# Patient Record
Sex: Male | Born: 1960
Health system: Southern US, Community
[De-identification: ages and names within clinical notes are randomized; demographics above are authoritative.]

## PROBLEM LIST (undated history)

## (undated) DIAGNOSIS — I10 Essential (primary) hypertension: Secondary | ICD-10-CM

## (undated) DIAGNOSIS — S86119A Strain of other muscle(s) and tendon(s) of posterior muscle group at lower leg level, unspecified leg, initial encounter: Secondary | ICD-10-CM

## (undated) DIAGNOSIS — E119 Type 2 diabetes mellitus without complications: Secondary | ICD-10-CM

## (undated) HISTORY — DX: Strain of other muscle(s) and tendon(s) of posterior muscle group at lower leg level, unspecified leg, initial encounter: S86.119A

---

## 2005-03-14 ENCOUNTER — Emergency Department: Payer: Self-pay | Admitting: Unknown Physician Specialty

## 2006-08-31 ENCOUNTER — Ambulatory Visit: Payer: Self-pay | Admitting: Gastroenterology

## 2013-05-02 ENCOUNTER — Ambulatory Visit: Payer: Self-pay | Admitting: Family Medicine

## 2014-09-19 ENCOUNTER — Telehealth: Payer: Self-pay | Admitting: Family Medicine

## 2014-09-19 DIAGNOSIS — E785 Hyperlipidemia, unspecified: Secondary | ICD-10-CM

## 2014-09-19 NOTE — Telephone Encounter (Signed)
Pt is requesting a lb slip to labs rechecked.  YQ#[phone]/BMCB#[phone]/MJ

## 2014-09-19 NOTE — Telephone Encounter (Signed)
Printed please notify patient. Thanks.   

## 2014-09-23 NOTE — Telephone Encounter (Signed)
Pt advised as directed below.   Thanks,   -Basma Buchner  

## 2014-09-26 ENCOUNTER — Telehealth: Payer: Self-pay

## 2014-09-26 LAB — LIPID PANEL
Chol/HDL Ratio: 3.3 ratio units (ref 0.0–5.0)
Cholesterol, Total: 177 mg/dL (ref 100–199)
HDL: 54 mg/dL (ref >39)
LDL CALC: 100 mg/dL — AB (ref 0–99)
TRIGLYCERIDES: 117 mg/dL (ref 0–149)
VLDL Cholesterol Cal: 23 mg/dL (ref 5–40)

## 2014-09-26 LAB — COMPREHENSIVE METABOLIC PANEL
A/G RATIO: 1.3 (ref 1.1–2.5)
ALT: 25 IU/L (ref 0–44)
AST: 21 IU/L (ref 0–40)
Albumin: 4 g/dL (ref 3.5–5.5)
Alkaline Phosphatase: 55 IU/L (ref 39–117)
BILIRUBIN TOTAL: 0.4 mg/dL (ref 0.0–1.2)
BUN / CREAT RATIO: 17 (ref 9–20)
BUN: 15 mg/dL (ref 6–24)
CO2: 25 mmol/L (ref 18–29)
CREATININE: 0.89 mg/dL (ref 0.76–1.27)
Calcium: 9 mg/dL (ref 8.7–10.2)
Chloride: 97 mmol/L (ref 97–108)
GFR calc Af Amer: 111 mL/min/{1.73_m2} (ref >59)
GFR, EST NON AFRICAN AMERICAN: 96 mL/min/{1.73_m2} (ref >59)
GLOBULIN, TOTAL: 3 g/dL (ref 1.5–4.5)
Glucose: 99 mg/dL (ref 65–99)
POTASSIUM: 4.1 mmol/L (ref 3.5–5.2)
SODIUM: 140 mmol/L (ref 134–144)
Total Protein: 7 g/dL (ref 6.0–8.5)

## 2014-09-26 NOTE — Telephone Encounter (Signed)
-----   Message from Nancy Maloney, MD sent at 09/26/2014  8:53 AM EDT ----- Labs stable. Please notify patient. Thanks. 

## 2014-09-26 NOTE — Telephone Encounter (Signed)
LMTCB 09/26/2014  Thanks,   -Laura  

## 2014-09-26 NOTE — Telephone Encounter (Signed)
LMTCB 09/26/2014  Thanks,   -Verlia Kaney  

## 2014-09-26 NOTE — Telephone Encounter (Signed)
-----   Message from Lorie PhenixNancy Maloney, MD sent at 09/26/2014  8:53 AM EDT ----- Labs stable. Please notify patient. Thanks.

## 2014-09-29 NOTE — Telephone Encounter (Signed)
Pt advised.   Thanks,   -Laura  

## 2015-01-02 ENCOUNTER — Other Ambulatory Visit: Payer: Self-pay | Admitting: Family Medicine

## 2015-01-02 DIAGNOSIS — I10 Essential (primary) hypertension: Secondary | ICD-10-CM | POA: Insufficient documentation

## 2015-01-02 DIAGNOSIS — Z8739 Personal history of other diseases of the musculoskeletal system and connective tissue: Secondary | ICD-10-CM | POA: Insufficient documentation

## 2015-01-02 DIAGNOSIS — J309 Allergic rhinitis, unspecified: Secondary | ICD-10-CM | POA: Insufficient documentation

## 2015-01-02 DIAGNOSIS — M109 Gout, unspecified: Secondary | ICD-10-CM

## 2015-01-02 MED ORDER — INDAPAMIDE 2.5 MG PO TABS: 2.5000 mg | ORAL_TABLET | Freq: Every day | ORAL | Status: DC

## 2015-01-02 MED ORDER — ACEBUTOLOL HCL 400 MG PO CAPS: 400.0000 mg | ORAL_CAPSULE | Freq: Two times a day (BID) | ORAL | Status: DC

## 2015-01-02 MED ORDER — ALLOPURINOL 300 MG PO TABS: 300.0000 mg | ORAL_TABLET | Freq: Every day | ORAL | Status: DC

## 2015-01-02 NOTE — Telephone Encounter (Signed)
Pt contacted office for refill request on the following medications: Acebutolol HCI 400 mg, Allopurinol 300 mg, & Indapamide 2.5 mg to [facility]. Thanks TNP

## 2015-01-02 NOTE — Addendum Note (Signed)
Addended by: Kavin LeechWALSH, LAURA E on: 01/02/2015 05:11 PM   Modules accepted: Orders

## 2015-01-23 ENCOUNTER — Other Ambulatory Visit: Payer: Self-pay

## 2015-01-23 ENCOUNTER — Other Ambulatory Visit: Payer: Self-pay | Admitting: Family Medicine

## 2015-01-23 DIAGNOSIS — I10 Essential (primary) hypertension: Secondary | ICD-10-CM

## 2015-01-23 DIAGNOSIS — M109 Gout, unspecified: Secondary | ICD-10-CM

## 2015-01-23 MED ORDER — ACEBUTOLOL HCL 400 MG PO CAPS: 400.0000 mg | ORAL_CAPSULE | Freq: Two times a day (BID) | ORAL | Status: DC

## 2015-01-23 MED ORDER — ALLOPURINOL 300 MG PO TABS: 300.0000 mg | ORAL_TABLET | Freq: Every day | ORAL | Status: DC

## 2015-01-23 MED ORDER — INDAPAMIDE 2.5 MG PO TABS: 2.5000 mg | ORAL_TABLET | Freq: Every day | ORAL | Status: DC

## 2015-01-23 NOTE — Telephone Encounter (Signed)
Pt contacted office for refill request on the following medications: Pt states the pharmacy has not rec'd these.   [facility]  [place], [place]/phone #[phone].  CB#[phone]/MW  acebutolol (SECTRAL) 400 MG  indapamide (LOZOL) 2.5 MG tablet  allopurinol (ZYLOPRIM) 300 MG tablet

## 2015-01-30 ENCOUNTER — Encounter: Payer: Self-pay | Admitting: Family Medicine

## 2015-03-13 DIAGNOSIS — D509 Iron deficiency anemia, unspecified: Secondary | ICD-10-CM | POA: Insufficient documentation

## 2015-03-13 DIAGNOSIS — K5900 Constipation, unspecified: Secondary | ICD-10-CM | POA: Insufficient documentation

## 2015-03-13 DIAGNOSIS — Z8669 Personal history of other diseases of the nervous system and sense organs: Secondary | ICD-10-CM | POA: Insufficient documentation

## 2015-03-19 ENCOUNTER — Encounter: Payer: Self-pay | Admitting: Family Medicine

## 2015-03-19 ENCOUNTER — Ambulatory Visit (INDEPENDENT_AMBULATORY_CARE_PROVIDER_SITE_OTHER): Payer: BLUE CROSS/BLUE SHIELD | Admitting: Family Medicine

## 2015-03-19 VITALS — BP 124/82 | HR 60 | Temp 98.0°F | Resp 16 | Ht 70.0 in | Wt 201.0 lb

## 2015-03-19 DIAGNOSIS — Z125 Encounter for screening for malignant neoplasm of prostate: Secondary | ICD-10-CM

## 2015-03-19 DIAGNOSIS — M1000 Idiopathic gout, unspecified site: Secondary | ICD-10-CM | POA: Diagnosis not present

## 2015-03-19 DIAGNOSIS — K5900 Constipation, unspecified: Secondary | ICD-10-CM

## 2015-03-19 DIAGNOSIS — E785 Hyperlipidemia, unspecified: Secondary | ICD-10-CM | POA: Diagnosis not present

## 2015-03-19 DIAGNOSIS — I10 Essential (primary) hypertension: Secondary | ICD-10-CM | POA: Diagnosis not present

## 2015-03-19 DIAGNOSIS — Z0000 Encounter for general adult medical examination without abnormal findings: Secondary | ICD-10-CM | POA: Diagnosis not present

## 2015-03-19 MED ORDER — POLYETHYLENE GLYCOL 3350 17 GM/SCOOP PO POWD: 17.0000 g | Freq: Every day | ORAL | Status: AC

## 2015-03-19 NOTE — Progress Notes (Signed)
Patient ID: Benjamin [name], male   DOB: May 17, 1960, 54 y.o.   MRN: [id number]        Patient: Benjamin [name], Male    DOB: May 17, 1960, 54 y.o.   MRN: [id number] Visit Date: 03/19/2015  Today's Provider: Lorie PhenixNancy Daylyn Azbill, MD   Chief Complaint  Patient presents with  . Annual Exam   Subjective:    Annual physical exam Benjamin [name] is a 54 y.o. male who presents today for health maintenance and complete physical. He feels well. He reports exercising active with daily activities. He reports he is sleeping well.  Does have stress at work. Thinks handling it well.    01/27/14 CPE 12/29/09 Colonoscopy 01/27/14 PSA- 0.9  Lab Results  Component Value Date   GLUCOSE 99 09/25/2014   CHOL 177 09/25/2014   TRIG 117 09/25/2014   HDL 54 09/25/2014   LDLCALC 100* 09/25/2014   ALT 25 09/25/2014   AST 21 09/25/2014   NA 140 09/25/2014   K 4.1 09/25/2014   CL 97 09/25/2014   CREATININE 0.89 09/25/2014   BUN 15 09/25/2014   CO2 25 09/25/2014    -----------------------------------------------------------------   Review of Systems  Constitutional: Negative.   HENT: Positive for voice change.   Eyes: Negative.   Respiratory: Negative.   Cardiovascular: Negative.   Gastrointestinal: Negative.   Endocrine: Negative.   Genitourinary: Negative.   Musculoskeletal: Negative.   Skin: Negative.   Allergic/Immunologic: Positive for environmental allergies.  Neurological: Negative.   Hematological: Negative.   Psychiatric/Behavioral: Negative.     Social History      He  reports that he has never smoked. He has never used smokeless tobacco. He reports that he does not drink alcohol or use illicit drugs.       Social History   Social History  . Marital Status: Married    Spouse Name: N/A  . Number of Children: N/A  . Years of Education: N/A   Social History Main Topics  . Smoking status: Never Smoker   . Smokeless tobacco: Never Used  . Alcohol Use: No  . Drug Use: No   . Sexual Activity: Not Asked   Other Topics Concern  . None   Social History Narrative    History reviewed. No pertinent past medical history.   Patient Active Problem List   Diagnosis Date Noted  . CN (constipation) 03/13/2015  . History of migraine headaches 03/13/2015  . Anemia, iron deficiency 03/13/2015  . Allergic rhinitis 01/02/2015  . Essential (primary) hypertension 01/02/2015  . Gout 01/02/2015  . Hyperlipemia 09/19/2014    History reviewed. No pertinent past surgical history.  Family History        Family Status  Relation Status Death Age  . Mother Deceased     stroke  . Father Deceased 7480    cancer  . Sister Alive   . Brother Alive         His family history includes Cancer in his sister; Diabetes in his mother; Heart disease in his brother and mother.    No Known Allergies  Previous Medications   ACEBUTOLOL (SECTRAL) 400 MG CAPSULE    Take 1 capsule (400 mg total) by mouth 2 (two) times daily.   ALLOPURINOL (ZYLOPRIM) 300 MG TABLET    Take 1 tablet (300 mg total) by mouth daily.   INDAPAMIDE (LOZOL) 2.5 MG TABLET    Take 1 tablet (2.5 mg total) by mouth daily.    Patient Care Team: Lorie PhenixNancy Montague Corella,  MD as PCP - General (Family Medicine)     Objective:   Vitals: BP 124/82 mmHg  Pulse 60  Temp(Src) 98 F (36.7 C) (Oral)  Resp 16  Ht  (1.778 m)  Wt 201 lb (91.173 kg)  BMI 28.84 kg/m2  SpO2 98%   Physical Exam  Constitutional: He is oriented to person, place, and time. He appears well-developed and well-nourished.  HENT:  Head: Normocephalic and atraumatic.  Right Ear: External ear normal.  Left Ear: External ear normal.  Nose: Nose normal.  Mouth/Throat: Oropharynx is clear and moist.  Eyes: Conjunctivae and EOM are normal. Pupils are equal, round, and reactive to light.  Neck: Normal range of motion. Neck supple.  Cardiovascular: Normal rate, regular rhythm, normal heart sounds and intact distal pulses.   Pulmonary/Chest: Effort  normal and breath sounds normal.  Abdominal: Soft. Bowel sounds are normal.  Musculoskeletal: Normal range of motion.  Neurological: He is alert and oriented to person, place, and time.  Skin: Skin is warm and dry.  Psychiatric: He has a normal mood and affect. His behavior is normal. Judgment and thought content normal.     Depression Screen PHQ 2/9 Scores 03/19/2015  PHQ - 2 Score 0      Assessment & Plan:     Routine Health Maintenance and Physical Exam  Exercise Activities and Dietary recommendations Goals    . Exercise 150 minutes per week (moderate activity)       Immunization History  Administered Date(s) Administered  . Td 05/20/2013  . Tdap 05/20/2013   1. Annual physical exam Stable. Encouraged patient to exercise more.   2. Idiopathic gout, unspecified chronicity, unspecified site Stable. Check labs.  - Uric acid  3. Essential (primary) hypertension Condition is stable. Please continue current medication and  plan of care as noted.    4. Hyperlipemia Will check labs.  - Comprehensive metabolic panel - Hemoglobin A1c - Lipid panel  5. Prostate cancer screening - PSA  6. Constipation, unspecified constipation type Will try Miralax as needed.   - polyethylene glycol powder (GLYCOLAX/MIRALAX) powder; Take 17 g by mouth daily.  Dispense: 3350 g; Refill: 1  Patient was seen and examined by Leo Grosser, MD, and note scribed by Rondel Baton, CMA.  I have reviewed the document for accuracy and completeness and I agree with above. Leo Grosser, MD   Lorie Phenix, MD      --------------------------------------------------------------------

## 2015-04-07 IMAGING — CR DG KNEE 1-2V*L*
1 series · 3 of 3 positions shown · non-contrast
Comparison: None.

CLINICAL DATA: Knee pain

EXAM:
LEFT KNEE - 1-2 VIEW

[Series 1: ap · 0.17mm/px · 3 of 3 slices shown]
[im 1/3]
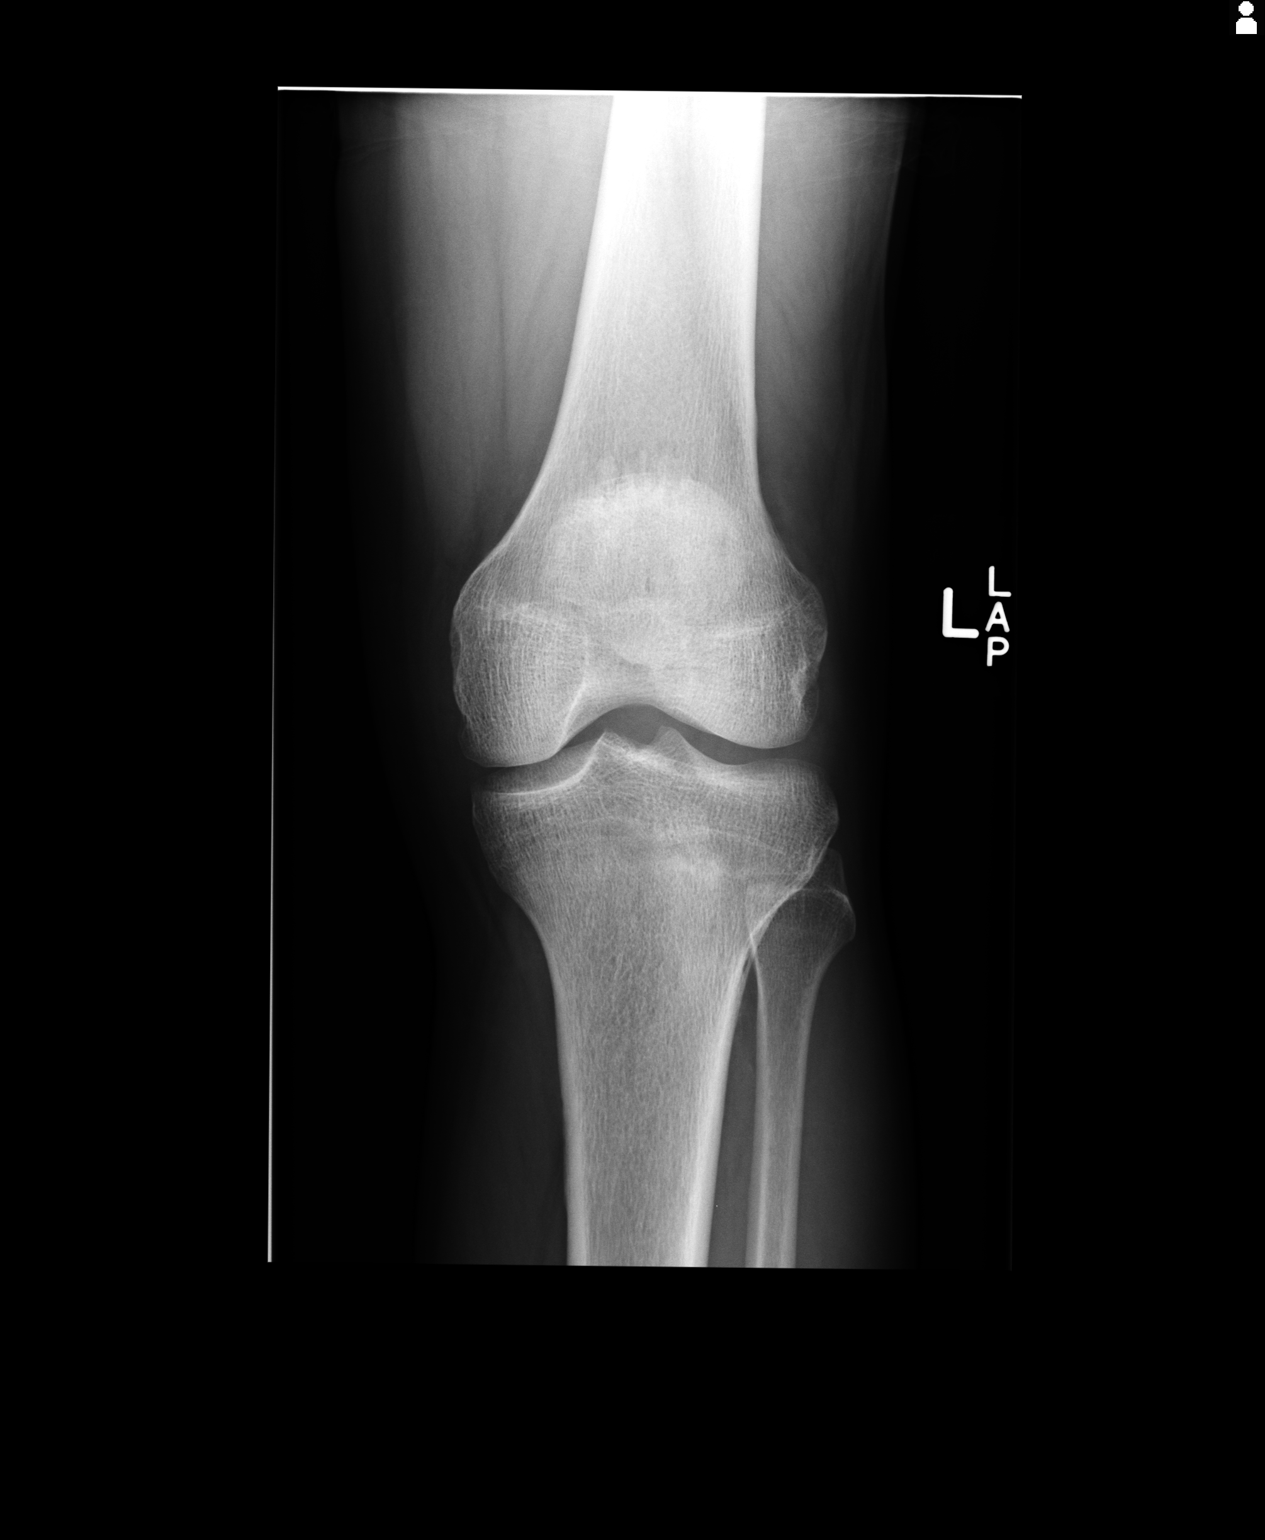
[im 2/3]
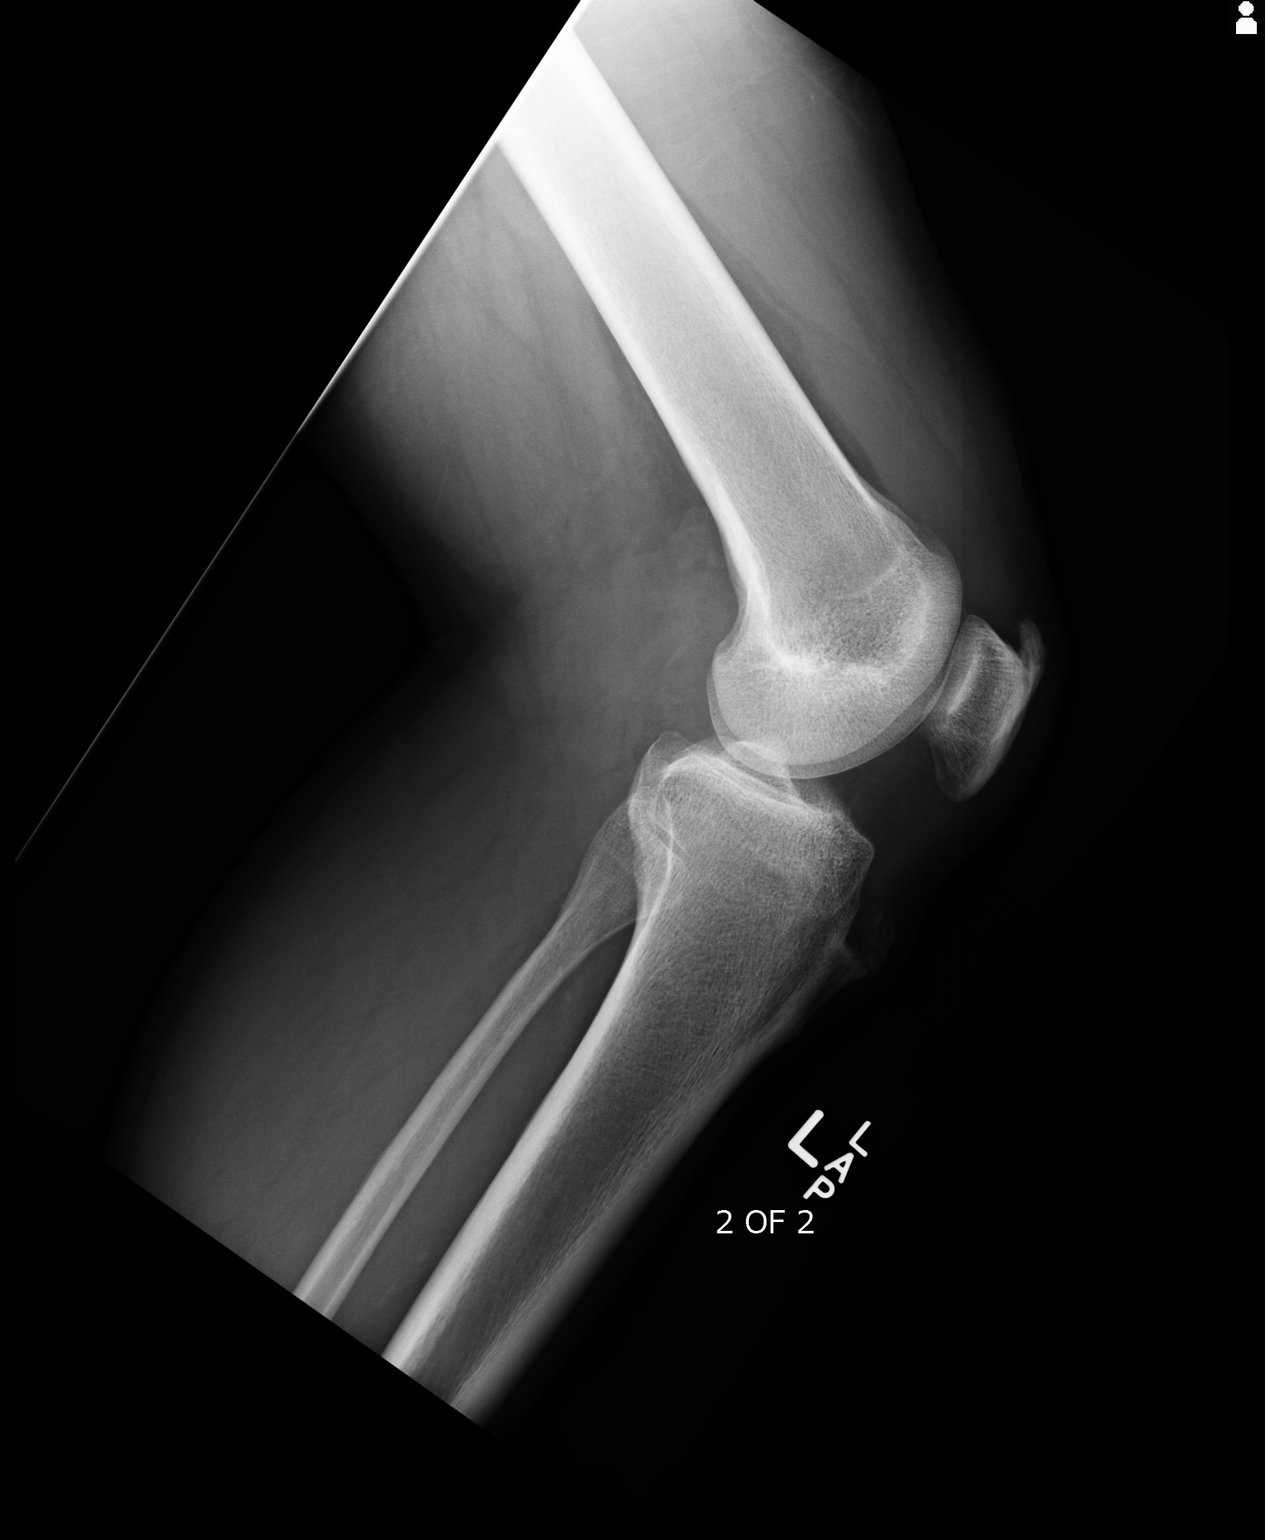
[im 3/3]
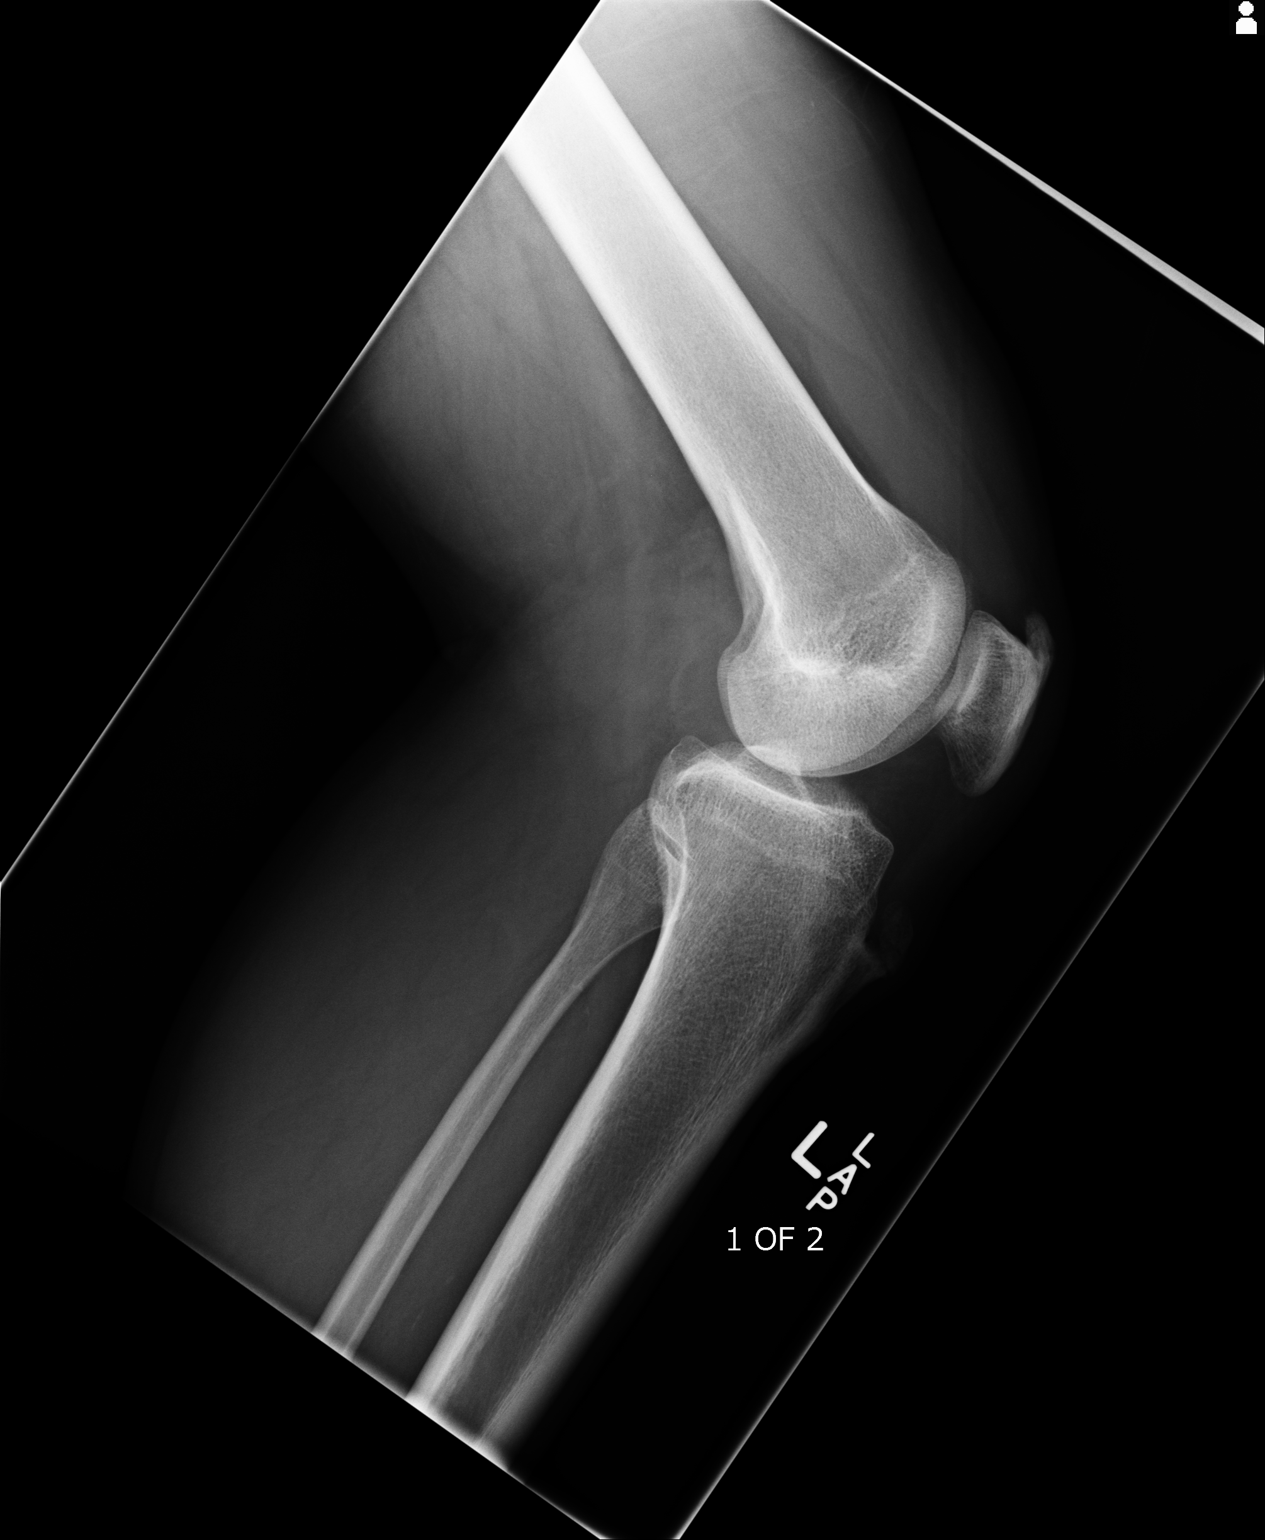

[3 of 3 positions shown; findings below may reference images not displayed]

FINDINGS: Frontal and lateral views were obtained. There is no fracture,
dislocation, or effusion. Joint spaces appear intact. There is a
prominent spur arising from the anterior superior patella.
IMPRESSION: Prominent anterior superior patellar spur. No fracture or effusion.
No erosive change.

## 2015-04-11 LAB — COMPREHENSIVE METABOLIC PANEL
ALT: 38 IU/L (ref 0–44)
AST: 28 IU/L (ref 0–40)
Albumin/Globulin Ratio: 1.2 (ref 1.1–2.5)
Albumin: 4 g/dL (ref 3.5–5.5)
Alkaline Phosphatase: 58 IU/L (ref 39–117)
BUN/Creatinine Ratio: 10 (ref 9–20)
BUN: 10 mg/dL (ref 6–24)
Bilirubin Total: 0.3 mg/dL (ref 0.0–1.2)
CO2: 25 mmol/L (ref 18–29)
CREATININE: 0.98 mg/dL (ref 0.76–1.27)
Calcium: 9 mg/dL (ref 8.7–10.2)
Chloride: 98 mmol/L (ref 96–106)
GFR, EST AFRICAN AMERICAN: 100 mL/min/{1.73_m2} (ref >59)
GFR, EST NON AFRICAN AMERICAN: 86 mL/min/{1.73_m2} (ref >59)
Globulin, Total: 3.4 g/dL (ref 1.5–4.5)
Glucose: 134 mg/dL — ABNORMAL HIGH (ref 65–99)
Potassium: 4.1 mmol/L (ref 3.5–5.2)
SODIUM: 138 mmol/L (ref 134–144)
TOTAL PROTEIN: 7.4 g/dL (ref 6.0–8.5)

## 2015-04-11 LAB — LIPID PANEL
CHOLESTEROL TOTAL: 164 mg/dL (ref 100–199)
Chol/HDL Ratio: 3.3 ratio units (ref 0.0–5.0)
HDL: 49 mg/dL (ref >39)
LDL CALC: 94 mg/dL (ref 0–99)
Triglycerides: 105 mg/dL (ref 0–149)
VLDL CHOLESTEROL CAL: 21 mg/dL (ref 5–40)

## 2015-04-11 LAB — HEMOGLOBIN A1C
ESTIMATED AVERAGE GLUCOSE: 128 mg/dL
Hgb A1c MFr Bld: 6.1 % — ABNORMAL HIGH (ref 4.8–5.6)

## 2015-04-11 LAB — URIC ACID: Uric Acid: 5.9 mg/dL (ref 3.7–8.6)

## 2015-04-11 LAB — PSA: Prostate Specific Ag, Serum: 0.6 ng/mL (ref 0.0–4.0)

## 2015-04-13 ENCOUNTER — Telehealth: Payer: Self-pay

## 2015-04-13 NOTE — Telephone Encounter (Signed)
-----   Message from Lorie Phenix, MD sent at 04/12/2015  8:47 AM EST ----- Labs fairly stable. Blood sugar in pre-diabetic range at 6.1%.  Cholesterol in normal range and 10 year risk of heart disease is 9.8%, right at border line for starting medication medication secondary to hypertension. Also, if develops diabetes, risks go up to 17.9%. Make sure to eat healthy and exercise.  Important to recheck in one year. Please see if patient wants to start mediation or continue to work on lifestyle changes.   Thanks.

## 2015-04-13 NOTE — Telephone Encounter (Signed)
Pt advised, he says he is going to work on lifestyle changes before starting a medication.  Thanks,   -Vernona Rieger

## 2015-04-13 NOTE — Telephone Encounter (Signed)
LMTCB Kymoni Monday Drozdowski, CMA  

## 2015-04-13 NOTE — Telephone Encounter (Signed)
-----   Message from Nancy Maloney, MD sent at 04/12/2015  8:47 AM EST ----- Labs fairly stable. Blood sugar in pre-diabetic range at 6.1%.  Cholesterol in normal range and 10 year risk of heart disease is 9.8%, right at border line for starting medication medication secondary to hypertension. Also, if develops diabetes, risks go up to 17.9%. Make sure to eat healthy and exercise.  Important to recheck in one year. Please see if patient wants to start mediation or continue to work on lifestyle changes.   Thanks. 

## 2015-05-12 ENCOUNTER — Ambulatory Visit (INDEPENDENT_AMBULATORY_CARE_PROVIDER_SITE_OTHER): Payer: BLUE CROSS/BLUE SHIELD | Admitting: Family Medicine

## 2015-05-12 ENCOUNTER — Encounter: Payer: Self-pay | Admitting: Family Medicine

## 2015-05-12 VITALS — BP 104/80 | HR 56 | Temp 98.4°F | Resp 16 | Wt 203.0 lb

## 2015-05-12 DIAGNOSIS — J069 Acute upper respiratory infection, unspecified: Secondary | ICD-10-CM | POA: Diagnosis not present

## 2015-05-12 DIAGNOSIS — I10 Essential (primary) hypertension: Secondary | ICD-10-CM

## 2015-05-12 DIAGNOSIS — S86119A Strain of other muscle(s) and tendon(s) of posterior muscle group at lower leg level, unspecified leg, initial encounter: Secondary | ICD-10-CM | POA: Insufficient documentation

## 2015-05-12 DIAGNOSIS — S86112A Strain of other muscle(s) and tendon(s) of posterior muscle group at lower leg level, left leg, initial encounter: Secondary | ICD-10-CM

## 2015-05-12 DIAGNOSIS — S86812A Strain of other muscle(s) and tendon(s) at lower leg level, left leg, initial encounter: Secondary | ICD-10-CM | POA: Diagnosis not present

## 2015-05-12 DIAGNOSIS — M109 Gout, unspecified: Secondary | ICD-10-CM | POA: Diagnosis not present

## 2015-05-12 HISTORY — DX: Strain of other muscle(s) and tendon(s) of posterior muscle group at lower leg level, unspecified leg, initial encounter: S86.119A

## 2015-05-12 MED ORDER — INDAPAMIDE 2.5 MG PO TABS: 2.5000 mg | ORAL_TABLET | Freq: Every day | ORAL | Status: DC

## 2015-05-12 MED ORDER — ACEBUTOLOL HCL 400 MG PO CAPS: 400.0000 mg | ORAL_CAPSULE | Freq: Two times a day (BID) | ORAL | Status: DC

## 2015-05-12 MED ORDER — ALLOPURINOL 300 MG PO TABS: 300.0000 mg | ORAL_TABLET | Freq: Every day | ORAL | Status: DC

## 2015-05-12 NOTE — Progress Notes (Signed)
Subjective:    Patient ID: Benjamin [name], male    DOB: [birth year], 55 y.o.   MRN: [id number]  URI  This is a new problem. The current episode started in the past 7 days ("couple days"). The problem has been unchanged. Maximum temperature: Pt has not checked temperature but had chills and sweats. Associated symptoms include congestion, coughing (productive with white sputum), rhinorrhea and sneezing. Pertinent negatives include no abdominal pain, chest pain, diarrhea, dysuria, ear pain, headaches, nausea, neck pain, plugged ear sensation, rash, sinus pain, sore throat, swollen glands, vomiting or wheezing. Associated symptoms comments: Myalgias are present in the LLE. Treatments tried: Coricidin, increasing fluids, Robitussin. The treatment provided no relief.      Review of Systems  Constitutional: Positive for chills and diaphoresis.  HENT: Positive for congestion, rhinorrhea and sneezing. Negative for ear pain and sore throat.   Respiratory: Positive for cough (productive with white sputum). Negative for wheezing.   Cardiovascular: Negative for chest pain.  Gastrointestinal: Negative for nausea, vomiting, abdominal pain and diarrhea.  Genitourinary: Negative for dysuria.  Musculoskeletal: Positive for myalgias (LLE). Negative for neck pain.  Skin: Negative for rash.  Neurological: Negative for headaches.   BP 104/80 mmHg  Pulse 56  Temp(Src) 98.4 F (36.9 C) (Oral)  Resp 16  Wt 203 lb (92.08 kg)  SpO2 98%   Patient Active Problem List   Diagnosis Date Noted  . Prostate cancer screening 03/19/2015  . CN (constipation) 03/13/2015  . History of migraine headaches 03/13/2015  . Anemia, iron deficiency 03/13/2015  . Allergic rhinitis 01/02/2015  . Essential (primary) hypertension 01/02/2015  . Gout 01/02/2015  . Hyperlipemia 09/19/2014   No past medical history on file. Current Outpatient Prescriptions on File Prior to Visit  Medication Sig  . acebutolol (SECTRAL) 400 MG  capsule Take 1 capsule (400 mg total) by mouth 2 (two) times daily.  Marland Kitchen allopurinol (ZYLOPRIM) 300 MG tablet Take 1 tablet (300 mg total) by mouth daily.  . indapamide (LOZOL) 2.5 MG tablet Take 1 tablet (2.5 mg total) by mouth daily.  . polyethylene glycol powder (GLYCOLAX/MIRALAX) powder Take 17 g by mouth daily.   No current facility-administered medications on file prior to visit.   No Known Allergies No past surgical history on file. Social History   Social History  . Marital Status: Married    Spouse Name: N/A  . Number of Children: N/A  . Years of Education: N/A   Occupational History  . Not on file.   Social History Main Topics  . Smoking status: Never Smoker   . Smokeless tobacco: Never Used  . Alcohol Use: No  . Drug Use: No  . Sexual Activity: Not on file   Other Topics Concern  . Not on file   Social History Narrative   Family History  Problem Relation Age of Onset  . Diabetes Mother   . Heart disease Mother   . Cancer Sister   . Heart disease Brother       Objective:   Physical Exam  Constitutional: He is oriented to person, place, and time. He appears well-developed and well-nourished.  HENT:  Head: Normocephalic and atraumatic.  Right Ear: Tympanic membrane and external ear normal.  Left Ear: Tympanic membrane and external ear normal.  Nose: Mucosal edema and rhinorrhea present. Right sinus exhibits no maxillary sinus tenderness. Left sinus exhibits no maxillary sinus tenderness.  Mouth/Throat: Uvula is midline and oropharynx is clear and moist.  Eyes: Conjunctivae and EOM are  normal. Pupils are equal, round, and reactive to light.  Neck: Normal range of motion. Neck supple.  Cardiovascular: Normal rate and regular rhythm.   Pulmonary/Chest: Effort normal and breath sounds normal. He has no wheezes. He has no rales.  Neurological: He is alert and oriented to person, place, and time.   BP 104/80 mmHg  Pulse 56  Temp(Src) 98.4 F (36.9 C) (Oral)   Resp 16  Wt 203 lb (92.08 kg)  SpO2 98%      Assessment & Plan:  1. Essential (primary) hypertension Condition is stable. Please continue current medication and  plan of care as noted.   - acebutolol (SECTRAL) 400 MG capsule; Take 1 capsule (400 mg total) by mouth 2 (two) times daily.  Dispense: 180 capsule; Refill: 3 - indapamide (LOZOL) 2.5 MG tablet; Take 1 tablet (2.5 mg total) by mouth daily.  Dispense: 90 tablet; Refill: 3  2. Gout, unspecified cause, unspecified chronicity, unspecified site Condition is stable. Please continue current medication and  plan of care as noted.   - allopurinol (ZYLOPRIM) 300 MG tablet; Take 1 tablet (300 mg total) by mouth daily.  Dispense: 90 tablet; Refill: 3  3. Gastrocnemius muscle tear, left, initial encounter Stretch more regularly. Use rolling stick to decrease tension and help healing.   Patient instructed to call back if condition worsens or does not improve.     4. Upper respiratory infection Flu is negative.  Will treat OTC. Patient instructed to call back if condition worsens or does not improve.    Results for orders placed or performed in visit on 05/12/15  POCT Influenza A/B  Result Value Ref Range   Influenza A, POC Negative Negative   Influenza B, POC Negative Negative   Patient was seen and examined by Leo Grosser, MD, and note scribed by Allene Dillon, CMA. I have reviewed the document for accuracy and completeness and I agree with above. Leo Grosser, MD   Lorie Phenix, MD

## 2015-05-13 LAB — POCT INFLUENZA A/B
INFLUENZA B, POC: NEGATIVE
Influenza A, POC: NEGATIVE

## 2015-08-10 DIAGNOSIS — H40031 Anatomical narrow angle, right eye: Secondary | ICD-10-CM | POA: Diagnosis not present

## 2015-08-10 DIAGNOSIS — H0289 Other specified disorders of eyelid: Secondary | ICD-10-CM | POA: Diagnosis not present

## 2015-08-10 DIAGNOSIS — H2513 Age-related nuclear cataract, bilateral: Secondary | ICD-10-CM | POA: Diagnosis not present

## 2015-08-10 DIAGNOSIS — H40032 Anatomical narrow angle, left eye: Secondary | ICD-10-CM | POA: Diagnosis not present

## 2016-03-22 ENCOUNTER — Encounter: Payer: BLUE CROSS/BLUE SHIELD | Admitting: Family Medicine

## 2016-03-22 DIAGNOSIS — T2661XA Corrosion of cornea and conjunctival sac, right eye, initial encounter: Secondary | ICD-10-CM | POA: Diagnosis not present

## 2016-03-23 ENCOUNTER — Ambulatory Visit (INDEPENDENT_AMBULATORY_CARE_PROVIDER_SITE_OTHER): Payer: BLUE CROSS/BLUE SHIELD | Admitting: Family Medicine

## 2016-03-23 ENCOUNTER — Encounter: Payer: Self-pay | Admitting: Family Medicine

## 2016-03-23 VITALS — BP 146/92 | HR 52 | Temp 97.9°F | Resp 16 | Ht 70.0 in | Wt 208.0 lb

## 2016-03-23 DIAGNOSIS — E119 Type 2 diabetes mellitus without complications: Secondary | ICD-10-CM | POA: Insufficient documentation

## 2016-03-23 DIAGNOSIS — I10 Essential (primary) hypertension: Secondary | ICD-10-CM | POA: Diagnosis not present

## 2016-03-23 DIAGNOSIS — Z8739 Personal history of other diseases of the musculoskeletal system and connective tissue: Secondary | ICD-10-CM | POA: Diagnosis not present

## 2016-03-23 DIAGNOSIS — Z0000 Encounter for general adult medical examination without abnormal findings: Secondary | ICD-10-CM | POA: Diagnosis not present

## 2016-03-23 DIAGNOSIS — Z125 Encounter for screening for malignant neoplasm of prostate: Secondary | ICD-10-CM | POA: Diagnosis not present

## 2016-03-23 DIAGNOSIS — R739 Hyperglycemia, unspecified: Secondary | ICD-10-CM | POA: Diagnosis not present

## 2016-03-23 DIAGNOSIS — E790 Hyperuricemia without signs of inflammatory arthritis and tophaceous disease: Secondary | ICD-10-CM | POA: Insufficient documentation

## 2016-03-23 DIAGNOSIS — T2661XD Corrosion of cornea and conjunctival sac, right eye, subsequent encounter: Secondary | ICD-10-CM | POA: Diagnosis not present

## 2016-03-23 DIAGNOSIS — Z1159 Encounter for screening for other viral diseases: Secondary | ICD-10-CM

## 2016-03-23 NOTE — Progress Notes (Signed)
Patient: Benjamin Benjamin [name] Hapke, Male    DOB: 27-Feb-1961, 56 y.o.   MRN: [id number] Visit Date: 03/23/2016  Today's Provider: Mila Merryonald Fisher, MD   Chief Complaint  Patient presents with  . Annual Exam   Subjective:   This is Benjamin [name] previous patient of Dr. Elease HashimotoMaloney present today as new patient to me to establish care and follow up on chronic medical problems.    Annual physical exam Benjamin Benjamin [name] is Benjamin [name] 56 y.o. male who presents today for health maintenance and complete physical. He feels well. He reports exercising not regularly. He reports he is sleeping well.  Colonoscopy- 12/29/2009 by previous patient report. Patient reports that this was normal.    Hypertension, follow-up:  BP Readings from Last 3 Encounters:  03/23/16 (!) 148/84  05/12/15 104/80  03/19/15 124/82    He was last seen for hypertension 10 months ago.  BP at that visit was 104/80. Management since that visit includes no change. He reports good compliance with treatment. He is not having side effects.  He is not exercising. He is adherent to low salt diet.   Outside blood pressures are checked occasionally. Patient denies exertional chest pressure/discomfort, lower extremity edema, palpitations and tachypnea.   Cardiovascular risk factors include dyslipidemia.     Weight trend: stable Wt Readings from Last 3 Encounters:  03/23/16 208 lb (94.3 kg)  05/12/15 203 lb (92.1 kg)  03/19/15 201 lb (91.2 kg)    Current diet: well balanced   Gout, follow up: Patient was last seen for this about 10 months ago. No changes were made in his medications. Patient currently takes allopurinol, and reports that this controls his symptoms. Has had no gout flares for Benjamin [name] few years.   Follow up elevated blood sugar. Lab Results  Component Value Date   HGBA1C 6.1 (H) 04/10/2015     He does consume Benjamin [name] lot of fruit and some fruit juices. Otherwise avoid sweets. Has not been exercising as much as usually lately. Does have  family history of diabetes.   Review of Systems  Constitutional: Negative.   HENT: Negative.   Eyes: Positive for redness.       Due to chemical exposure at work.  Respiratory: Negative.   Cardiovascular: Negative.   Gastrointestinal: Negative.   Endocrine: Negative.   Genitourinary: Negative.   Musculoskeletal: Negative.   Skin: Positive for color change and rash.       Due to chemical exposure at work yesterday.   Allergic/Immunologic: Negative.   Neurological: Negative.   Hematological: Negative.   Psychiatric/Behavioral: Negative.     Social History      He  reports that he has never smoked. He has never used smokeless tobacco. He reports that he does not drink alcohol or use drugs.       Social History   Social History  . Marital status: Married    Spouse name: N/Benjamin [name]  . Number of children: N/Benjamin [name]  . Years of education: N/Benjamin [name]   Social History Main Topics  . Smoking status: Never Smoker  . Smokeless tobacco: Never Used  . Alcohol use No  . Drug use: No  . Sexual activity: Not Asked   Other Topics Concern  . None   Social History Narrative  . None    No past medical history on file.   Patient Active Problem List   Diagnosis Date Noted  . Gastrocnemius muscle tear 05/12/2015  . Upper respiratory infection 05/12/2015  . Prostate  cancer screening 03/19/2015  . CN (constipation) 03/13/2015  . History of migraine headaches 03/13/2015  . Anemia, iron deficiency 03/13/2015  . Allergic rhinitis 01/02/2015  . Essential (primary) hypertension 01/02/2015  . Gout 01/02/2015  . Hyperlipemia 09/19/2014    No past surgical history on file.  Family History        Family Status  Relation Status  . Mother Deceased   stroke  . Father Deceased at age 10   cancer  . Sister Alive  . Brother Alive        His family history includes Cancer in his sister; Diabetes in his mother; Heart disease in his brother and mother.     No Known Allergies   Current Outpatient  Prescriptions:  .  acebutolol (SECTRAL) 400 MG capsule, Take 1 capsule (400 mg total) by mouth 2 (two) times daily., Disp: 180 capsule, Rfl: 3 .  allopurinol (ZYLOPRIM) 300 MG tablet, Take 1 tablet (300 mg total) by mouth daily., Disp: 90 tablet, Rfl: 3 .  indapamide (LOZOL) 2.5 MG tablet, Take 1 tablet (2.5 mg total) by mouth daily., Disp: 90 tablet, Rfl: 3 .  polyethylene glycol powder (GLYCOLAX/MIRALAX) powder, Take 17 g by mouth daily., Disp: 3350 g, Rfl: 1   Patient Care Team: Malva Limes, MD as PCP - General (Family Medicine)      Objective:   Vitals: BP (!) 148/84 (BP Location: Right Arm, Patient Position: Sitting, Cuff Size: Normal)   Pulse (!) 52   Temp 97.9 F (36.6 C)   Resp 16   Ht 5\' 10"  (1.778 m)   Wt 208 lb (94.3 kg)   SpO2 98%   BMI 29.84 kg/m    Physical Exam   General Appearance:    Alert, cooperative, no distress, appears stated age  Head:    Normocephalic, without obvious abnormality, atraumatic  Eyes:    PERRL, conjunctiva/corneas clear, EOM's intact, fundi    benign, both eyes       Ears:    Normal TM's and external ear canals, both ears  Nose:   Nares normal, septum midline, mucosa normal, no drainage   or sinus tenderness  Throat:   Lips, mucosa, and tongue normal; teeth and gums normal  Neck:   Supple, symmetrical, trachea midline, no adenopathy;       thyroid:  No enlargement/tenderness/nodules; no carotid   bruit or JVD  Back:     Symmetric, no curvature, ROM normal, no CVA tenderness  Lungs:     Clear to auscultation bilaterally, respirations unlabored  Chest wall:    No tenderness or deformity  Heart:    Regular rate and rhythm, S1 and S2 normal, no murmur, rub   or gallop  Abdomen:     Soft, non-tender, bowel sounds active all four quadrants,    no masses, no organomegaly  Genitalia:    deferred  Rectal:    deferred  Extremities:   Extremities normal, atraumatic, no cyanosis or edema  Pulses:   2+ and symmetric all extremities  Skin:    Skin color, texture, turgor normal, no rashes or lesions  Lymph nodes:   Cervical, supraclavicular, and axillary nodes normal  Neurologic:   CNII-XII intact. Normal strength, sensation and reflexes      throughout    Depression Screen PHQ 2/9 Scores 03/19/2015  PHQ - 2 Score 0      Assessment & Plan:     Routine Health Maintenance and Physical Exam  Exercise Activities and Dietary recommendations  Goals    . Exercise 150 minutes per week (moderate activity)       Immunization History  Administered Date(s) Administered  . Td 05/20/2013  . Tdap 05/20/2013    Health Maintenance  Topic Date Due  . Hepatitis C Screening  12-Dec-1960  . HIV Screening  08/04/1974  . COLONOSCOPY  08/03/2009  . INFLUENZA VACCINE  10/20/2015  . TETANUS/TDAP  05/21/2023     Discussed health benefits of physical activity, and encouraged him to engage in regular exercise appropriate for his age and condition.    --------------------------------------------------------------------  1. Annual physical exam  - Comprehensive metabolic panel - Lipid panel - TSH  2. Essential (primary) hypertension He states he did not take BP medication this morning, and has not been very strict with diet and exercise lately. Will follow up 6 months for BP check.   3. Prostate cancer screening  - PSA  4. Need for hepatitis C screening test  - Hepatitis C antibody  5. Hyperglycemia Counseled on avoiding high glycemic index foods and start exercising again.  - Hemoglobin A1c  The entirety of the information documented in the History of Present Illness, Review of Systems and Physical Exam were personally obtained by me. Portions of this information were initially documented by Anson [place], CMA and reviewed by me for thoroughness and accuracy.    Mila Merry, MD  [facility]

## 2016-03-24 ENCOUNTER — Encounter: Payer: Self-pay | Admitting: Family Medicine

## 2016-03-24 LAB — COMPREHENSIVE METABOLIC PANEL
A/G RATIO: 1.3 (ref 1.2–2.2)
ALT: 51 IU/L — ABNORMAL HIGH (ref 0–44)
AST: 31 IU/L (ref 0–40)
Albumin: 4.4 g/dL (ref 3.5–5.5)
Alkaline Phosphatase: 60 IU/L (ref 39–117)
BUN/Creatinine Ratio: 12 (ref 9–20)
BUN: 14 mg/dL (ref 6–24)
Bilirubin Total: 0.7 mg/dL (ref 0.0–1.2)
CALCIUM: 9.3 mg/dL (ref 8.7–10.2)
CO2: 27 mmol/L (ref 18–29)
Chloride: 96 mmol/L (ref 96–106)
Creatinine, Ser: 1.14 mg/dL (ref 0.76–1.27)
GFR calc Af Amer: 83 mL/min/{1.73_m2} (ref >59)
GFR, EST NON AFRICAN AMERICAN: 71 mL/min/{1.73_m2} (ref >59)
GLOBULIN, TOTAL: 3.4 g/dL (ref 1.5–4.5)
Glucose: 118 mg/dL — ABNORMAL HIGH (ref 65–99)
POTASSIUM: 3.9 mmol/L (ref 3.5–5.2)
SODIUM: 138 mmol/L (ref 134–144)
TOTAL PROTEIN: 7.8 g/dL (ref 6.0–8.5)

## 2016-03-24 LAB — LIPID PANEL
CHOL/HDL RATIO: 3.8 ratio (ref 0.0–5.0)
Cholesterol, Total: 176 mg/dL (ref 100–199)
HDL: 46 mg/dL (ref >39)
LDL CALC: 113 mg/dL — AB (ref 0–99)
Triglycerides: 84 mg/dL (ref 0–149)
VLDL Cholesterol Cal: 17 mg/dL (ref 5–40)

## 2016-03-24 LAB — HEPATITIS C ANTIBODY

## 2016-03-24 LAB — TSH: TSH: 2.55 u[IU]/mL (ref 0.450–4.500)

## 2016-03-24 LAB — HEMOGLOBIN A1C
Est. average glucose Bld gHb Est-mCnc: 137 mg/dL
HEMOGLOBIN A1C: 6.4 % — AB (ref 4.8–5.6)

## 2016-03-24 LAB — PSA: Prostate Specific Ag, Serum: 0.5 ng/mL (ref 0.0–4.0)

## 2016-09-26 ENCOUNTER — Ambulatory Visit (INDEPENDENT_AMBULATORY_CARE_PROVIDER_SITE_OTHER): Payer: BLUE CROSS/BLUE SHIELD | Admitting: Family Medicine

## 2016-09-26 ENCOUNTER — Encounter: Payer: Self-pay | Admitting: Family Medicine

## 2016-09-26 VITALS — BP 144/84 | HR 48 | Temp 97.5°F | Resp 16 | Wt 218.0 lb

## 2016-09-26 DIAGNOSIS — E790 Hyperuricemia without signs of inflammatory arthritis and tophaceous disease: Secondary | ICD-10-CM | POA: Diagnosis not present

## 2016-09-26 DIAGNOSIS — I10 Essential (primary) hypertension: Secondary | ICD-10-CM | POA: Diagnosis not present

## 2016-09-26 DIAGNOSIS — E119 Type 2 diabetes mellitus without complications: Secondary | ICD-10-CM | POA: Diagnosis not present

## 2016-09-26 DIAGNOSIS — Z1211 Encounter for screening for malignant neoplasm of colon: Secondary | ICD-10-CM

## 2016-09-26 DIAGNOSIS — M79676 Pain in unspecified toe(s): Secondary | ICD-10-CM

## 2016-09-26 DIAGNOSIS — K219 Gastro-esophageal reflux disease without esophagitis: Secondary | ICD-10-CM

## 2016-09-26 LAB — POCT GLYCOSYLATED HEMOGLOBIN (HGB A1C)
Est. average glucose Bld gHb Est-mCnc: 177
Hemoglobin A1C: 7.8

## 2016-09-26 MED ORDER — ACEBUTOLOL HCL 400 MG PO CAPS: 400.0000 mg | ORAL_CAPSULE | Freq: Every day | ORAL | 1 refills | Status: DC

## 2016-09-26 MED ORDER — LOSARTAN POTASSIUM-HCTZ 50-12.5 MG PO TABS: 1.0000 | ORAL_TABLET | Freq: Every evening | ORAL | 1 refills | Status: DC

## 2016-09-26 MED ORDER — METFORMIN HCL 500 MG PO TABS: 500.0000 mg | ORAL_TABLET | Freq: Every day | ORAL | 1 refills | Status: DC

## 2016-09-26 NOTE — Progress Notes (Signed)
Patient: Benjamin [name] Male    DOB: September 01, 1960   56 y.o.   MRN: [id number] Visit Date: 09/26/2016  Today's Provider: Mila Merry, MD   Chief Complaint  Patient presents with  . Diabetes  . Hypertension   Subjective:    HPI   Prediabetes, Follow-up:   Lab Results  Component Value Date   HGBA1C 7.8 09/26/2016   HGBA1C 6.4 (H) 03/23/2016   HGBA1C 6.1 (H) 04/10/2015   GLUCOSE 118 (H) 03/23/2016   GLUCOSE 134 (H) 04/10/2015   GLUCOSE 99 09/25/2014    Last seen for for this6 months ago.  Management since that visit includes counseled on avoiding high glycemic index foods and increasing exercise. Current symptoms include paresthesia of the feet and have been unchanged. Patient reports that he has a numbness sensation on his left middle toe. He thinks it could be from when he injured it about 4 months ago, but the sensation is still there.  Weight trend: stable Prior visit with dietician: no Current diet: well balanced Current exercise: none  Pertinent Labs:    Component Value Date/Time   CHOL 176 03/23/2016 0958   TRIG 84 03/23/2016 0958   CHOLHDL 3.8 03/23/2016 0958   CREATININE 1.14 03/23/2016 0958    Wt Readings from Last 3 Encounters:  09/26/16 218 lb (98.9 kg)  03/23/16 208 lb (94.3 kg)  05/12/15 203 lb (92.1 kg)       Hypertension, follow-up:  BP Readings from Last 3 Encounters:  09/26/16 (!) 144/84  03/23/16 (!) 146/92  05/12/15 104/80    He was last seen for hypertension 6 months ago.  BP at that visit was 148/84. Management since that visit includes counseled on taking medication regularly. He reports good compliance with treatment. He is not having side effects.  He is not exercising. He is adherent to low salt diet.   Outside blood pressures are not being checked. He is experiencing none.  Patient denies exertional chest pressure/discomfort and lower extremity edema.     Weight trend: stable Wt Readings from Last 3  Encounters:  09/26/16 218 lb (98.9 kg)  03/23/16 208 lb (94.3 kg)  05/12/15 203 lb (92.1 kg)    Current diet: well balanced    He also report having reflux most days and has started taking Zantac 150 most days as well. States he has no symptoms when he takes Zantac 150.   He also reports stubbing his toe a couple of weeks ago and it still hurts on the ball of his food.      No Known Allergies   Current Outpatient Prescriptions:  .  acebutolol (SECTRAL) 400 MG capsule, Take 1 capsule (400 mg total) by mouth 2 (two) times daily., Disp: 180 capsule, Rfl: 3 .  allopurinol (ZYLOPRIM) 300 MG tablet, Take 1 tablet (300 mg total) by mouth daily., Disp: 90 tablet, Rfl: 3 .  indapamide (LOZOL) 2.5 MG tablet, Take 1 tablet (2.5 mg total) by mouth daily., Disp: 90 tablet, Rfl: 3 .  polyethylene glycol powder (GLYCOLAX/MIRALAX) powder, Take 17 g by mouth daily. (Patient not taking: Reported on 09/26/2016), Disp: 3350 g, Rfl: 1  Review of Systems  Constitutional: Negative.   Respiratory: Negative.   Cardiovascular: Negative.   Musculoskeletal: Negative.   Neurological: Positive for numbness.    Social History  Substance Use Topics  . Smoking status: Never Smoker  . Smokeless tobacco: Never Used  . Alcohol use No   Objective:   BP Marland Kitchen)  144/84 (BP Location: Right Arm, Patient Position: Sitting, Cuff Size: Normal)   Pulse (!) 48   Temp (!) 97.5 F (36.4 C)   Resp 16   Wt 218 lb (98.9 kg)   SpO2 99%   BMI 31.28 kg/m  Vitals:   09/26/16 0815  BP: (!) 144/84  Pulse: (!) 48  Resp: 16  Temp: (!) 97.5 F (36.4 C)  SpO2: 99%  Weight: 218 lb (98.9 kg)     Physical Exam   General Appearance:    Alert, cooperative, no distress  Eyes:    PERRL, conjunctiva/corneas clear, EOM's intact       Lungs:     Clear to auscultation bilaterally, respirations unlabored  Heart:    Bradycardic  Neurologic:   Awake, alert, oriented x 3. No apparent focal neurological           defect.          Results for orders placed or performed in visit on 09/26/16  POCT glycosylated hemoglobin (Hb A1C)  Result Value Ref Range   Hemoglobin A1C 7.8    Est. average glucose Bld gHb Est-mCnc 177        Assessment & Plan:     1. Diabetes mellitus without complication (HCC) Progressed from pre-diabetes to diabetes since last visit in January Start metformin 500mg  daily - POCT glycosylated hemoglobin (Hb A1C) - Ambulatory referral to diabetic education  2. Essential (primary) hypertension He is noted to be bradycardic today. Will reduce acebutolol to 400mg  QD and change indapamide to losartan-hctz 50-12.5. Check renal in about 3 weeks. He is to call if BP stays consistently above 150.  - Ambulatory referral to diabetic education - EKG 12-Lead  3. Gastroesophageal reflux disease, esophagitis presence not specified At this point is controlled with OTC H2 blocker  4. Colon cancer screening Last colonoscopy June 2008.  - Ambulatory referral to Gastroenterology  Return in about 3 months (around 12/27/2016).   6. Pain of toe, unspecified laterality Heeling contusion of toe, expect complete resolution in the next several weeks.   The entirety of the information documented in the History of Present Illness, Review of Systems and Physical Exam were personally obtained by me. Portions of this information were initially documented by Anson Oregonachelle Presley, CMA and reviewed by me for thoroughness and accuracy.        Mila Merryonald Caris Cerveny, MD  [facility]

## 2016-09-26 NOTE — Patient Instructions (Addendum)
Check your blood pressure  While at rest 2-3 days a week   We will need to check kidney functions and electrolytes in about 3 weeks to make sure new blood pressure medication is working well with your kidneys   DASH Eating Plan DASH stands for "Dietary Approaches to Stop Hypertension." The DASH eating plan is a healthy eating plan that has been shown to reduce high blood pressure (hypertension). It may also reduce your risk for type 2 diabetes, heart disease, and stroke. The DASH eating plan may also help with weight loss. What are tips for following this plan? General guidelines  Avoid eating more than 2,300 mg (milligrams) of salt (sodium) a day. If you have hypertension, you may need to reduce your sodium intake to 1,500 mg a day.  Limit alcohol intake to no more than 1 drink a day for nonpregnant women and 2 drinks a day for men. One drink equals 12 oz of beer, 5 oz of wine, or 1 oz of hard liquor.  Work with your health care provider to maintain a healthy body weight or to lose weight. Ask what an ideal weight is for you.  Get at least 30 minutes of exercise that causes your heart to beat faster (aerobic exercise) most days of the week. Activities may include walking, swimming, or biking.  Work with your health care provider or diet and nutrition specialist (dietitian) to adjust your eating plan to your individual calorie needs. Reading food labels  Check food labels for the amount of sodium per serving. Choose foods with less than 5 percent of the Daily Value of sodium. Generally, foods with less than 300 mg of sodium per serving fit into this eating plan.  To find whole grains, look for the word "whole" as the first word in the ingredient list. Shopping  Buy products labeled as "low-sodium" or "no salt added."  Buy fresh foods. Avoid canned foods and premade or frozen meals. Cooking  Avoid adding salt when cooking. Use salt-free seasonings or herbs instead of table salt or  sea salt. Check with your health care provider or pharmacist before using salt substitutes.  Do not fry foods. Cook foods using healthy methods such as baking, boiling, grilling, and broiling instead.  Cook with heart-healthy oils, such as olive, canola, soybean, or sunflower oil. Meal planning   Eat a balanced diet that includes: ? 5 or more servings of fruits and vegetables each day. At each meal, try to fill half of your plate with fruits and vegetables. ? Up to 6-8 servings of whole grains each day. ? Less than 6 oz of lean meat, poultry, or fish each day. A 3-oz serving of meat is about the same size as a deck of cards. One egg equals 1 oz. ? 2 servings of low-fat dairy each day. ? A serving of nuts, seeds, or beans 5 times each week. ? Heart-healthy fats. Healthy fats called Omega-3 fatty acids are found in foods such as flaxseeds and coldwater fish, like sardines, salmon, and mackerel.  Limit how much you eat of the following: ? Canned or prepackaged foods. ? Food that is high in trans fat, such as fried foods. ? Food that is high in saturated fat, such as fatty meat. ? Sweets, desserts, sugary drinks, and other foods with added sugar. ? Full-fat dairy products.  Do not salt foods before eating.  Try to eat at least 2 vegetarian meals each week.  Eat more home-cooked food and less restaurant,  buffet, and fast food.  When eating at a restaurant, ask that your food be prepared with less salt or no salt, if possible. What foods are recommended? The items listed may not be a complete list. Talk with your dietitian about what dietary choices are best for you. Grains Whole-grain or whole-wheat bread. Whole-grain or whole-wheat pasta. Brown rice. [place]. Bulgur. Whole-grain and low-sodium cereals. Pita bread. Low-fat, low-sodium crackers. Whole-wheat flour tortillas. Vegetables Fresh or frozen vegetables (raw, steamed, roasted, or grilled). Low-sodium or reduced-sodium  tomato and vegetable juice. Low-sodium or reduced-sodium tomato sauce and tomato paste. Low-sodium or reduced-sodium canned vegetables. Fruits All fresh, dried, or frozen fruit. Canned fruit in natural juice (without added sugar). Meat and other protein foods Skinless chicken or [place]. Ground chicken or [place]. Pork with fat trimmed off. Fish and seafood. Egg whites. Dried beans, peas, or lentils. Unsalted nuts, nut butters, and seeds. Unsalted canned beans. Lean cuts of beef with fat trimmed off. Low-sodium, lean deli meat. Dairy Low-fat (1%) or fat-free (skim) milk. Fat-free, low-fat, or reduced-fat cheeses. Nonfat, low-sodium ricotta or cottage cheese. Low-fat or nonfat yogurt. Low-fat, low-sodium cheese. Fats and oils Soft margarine without trans fats. Vegetable oil. Low-fat, reduced-fat, or light mayonnaise and salad dressings (reduced-sodium). Canola, safflower, olive, soybean, and sunflower oils. Avocado. Seasoning and other foods Herbs. Spices. Seasoning mixes without salt. Unsalted popcorn and pretzels. Fat-free sweets. What foods are not recommended? The items listed may not be a complete list. Talk with your dietitian about what dietary choices are best for you. Grains Baked goods made with fat, such as croissants, muffins, or some breads. Dry pasta or rice meal packs. Vegetables Creamed or fried vegetables. Vegetables in a cheese sauce. Regular canned vegetables (not low-sodium or reduced-sodium). Regular canned tomato sauce and paste (not low-sodium or reduced-sodium). Regular tomato and vegetable juice (not low-sodium or reduced-sodium). Angie Fava. Olives. Fruits Canned fruit in a light or heavy syrup. Fried fruit. Fruit in cream or butter sauce. Meat and other protein foods Fatty cuts of meat. Ribs. Fried meat. Berniece Salines. Sausage. Bologna and other processed lunch meats. Salami. Fatback. Hotdogs. Bratwurst. Salted nuts and seeds. Canned beans with added salt. Canned or smoked fish.  Whole eggs or egg yolks. Chicken or [place] with skin. Dairy Whole or 2% milk, cream, and half-and-half. Whole or full-fat cream cheese. Whole-fat or sweetened yogurt. Full-fat cheese. Nondairy creamers. Whipped toppings. Processed cheese and cheese spreads. Fats and oils Butter. Stick margarine. Lard. Shortening. Ghee. Bacon fat. Tropical oils, such as coconut, palm kernel, or palm oil. Seasoning and other foods Salted popcorn and pretzels. Onion salt, garlic salt, seasoned salt, table salt, and sea salt. Worcestershire sauce. Tartar sauce. Barbecue sauce. Teriyaki sauce. Soy sauce, including reduced-sodium. Steak sauce. Canned and packaged gravies. Fish sauce. Oyster sauce. Cocktail sauce. Horseradish that you find on the shelf. Ketchup. Mustard. Meat flavorings and tenderizers. Bouillon cubes. Hot sauce and Tabasco sauce. Premade or packaged marinades. Premade or packaged taco seasonings. Relishes. Regular salad dressings. Where to find more information:  National Heart, Lung, and [place]: https://wilson-eaton.com/  American Heart Association: www.heart.org Summary  The DASH eating plan is a healthy eating plan that has been shown to reduce high blood pressure (hypertension). It may also reduce your risk for type 2 diabetes, heart disease, and stroke.  With the DASH eating plan, you should limit salt (sodium) intake to 2,300 mg a day. If you have hypertension, you may need to reduce your sodium intake to 1,500 mg a day.  When  on the DASH eating plan, aim to eat more fresh fruits and vegetables, whole grains, lean proteins, low-fat dairy, and heart-healthy fats.  Work with your health care provider or diet and nutrition specialist (dietitian) to adjust your eating plan to your individual calorie needs. This information is not intended to replace advice given to you by your health care provider. Make sure you discuss any questions you have with your health care provider. Document Released:  02/24/2011 Document Revised: 02/29/2016 Document Reviewed: 02/29/2016 Elsevier Interactive Patient Education  2017 [employer].

## 2016-10-24 ENCOUNTER — Telehealth: Payer: Self-pay

## 2016-10-24 ENCOUNTER — Other Ambulatory Visit: Payer: Self-pay

## 2016-10-24 ENCOUNTER — Telehealth: Payer: Self-pay | Admitting: Family Medicine

## 2016-10-24 DIAGNOSIS — I10 Essential (primary) hypertension: Secondary | ICD-10-CM | POA: Diagnosis not present

## 2016-10-24 DIAGNOSIS — Z800 Family history of malignant neoplasm of digestive organs: Secondary | ICD-10-CM

## 2016-10-24 DIAGNOSIS — Z1211 Encounter for screening for malignant neoplasm of colon: Secondary | ICD-10-CM

## 2016-10-24 NOTE — Telephone Encounter (Signed)
Pt advised as below and will get BP checked when he pick ups lab slip.

## 2016-10-24 NOTE — Progress Notes (Unsigned)
Pt came in for BP check after picking up lab slip. He denies chest pain, SOB, palpitations, headaches. He states his BP has been "running pretty good" when he checks them at home. BP today was 118/74.

## 2016-10-24 NOTE — Telephone Encounter (Signed)
Please advise patient it is time to check renal panel since we changed his bp meds last month. Please leave order for him to pick up at front desk.  Also he should have his BP checked he comes in to pick up his lab slip.  

## 2016-10-24 NOTE — Telephone Encounter (Signed)
Gastroenterology Pre-Procedure Review  Request Date: 12/08/16 Requesting Physician: Dr. Tobi BastosAnna  PATIENT REVIEW QUESTIONS: The patient responded to the following health history questions as indicated:    1. Are you having any GI issues? yes (hemorrhoids) 2. Do you have a personal history of Polyps? yes (doesnt recall last colonoscopy) 3. Do you have a family history of Colon Cancer or Polyps? yes (Dad Colon Cancer) 4. Diabetes Mellitus? yes ( just diagnosed) 5. Joint replacements in the past 12 months?no 6. Major health problems in the past 3 months?no 7. Any artificial heart valves, MVP, or defibrillator?no    MEDICATIONS & ALLERGIES:    Patient reports the following regarding taking any anticoagulation/antiplatelet therapy:   Plavix, Coumadin, Eliquis, Xarelto, Lovenox, Pradaxa, Brilinta, or Effient? no Aspirin? no  Patient confirms/reports the following medications:  Current Outpatient Prescriptions  Medication Sig Dispense Refill  . acebutolol (SECTRAL) 400 MG capsule Take 1 capsule (400 mg total) by mouth daily. 1 capsule 1  . allopurinol (ZYLOPRIM) 300 MG tablet Take 1 tablet (300 mg total) by mouth daily. 90 tablet 3  . losartan-hydrochlorothiazide (HYZAAR) 50-12.5 MG tablet Take 1 tablet by mouth every evening. 90 tablet 1  . metFORMIN (GLUCOPHAGE) 500 MG tablet Take 1 tablet (500 mg total) by mouth daily. 30 tablet 1  . polyethylene glycol powder (GLYCOLAX/MIRALAX) powder Take 17 g by mouth daily. (Patient not taking: Reported on 09/26/2016) 3350 g 1  . ranitidine (ZANTAC) 150 MG tablet Take 150 mg by mouth 2 (two) times daily.     No current facility-administered medications for this visit.     Patient confirms/reports the following allergies:  No Known Allergies  No orders of the defined types were placed in this encounter.   AUTHORIZATION INFORMATION Primary Insurance: 1D#: Group #:  Secondary Insurance: 1D#: Group #:  SCHEDULE INFORMATION: Date:  12/08/16 Time: Location:[facility]

## 2016-10-24 NOTE — Telephone Encounter (Signed)
-----   Message from Malva Limesonald E Fisher, MD sent at 09/27/2016  1:43 PM EDT ----- Regarding: FW: need to check BP and renal last week of July   ----- Message ----- From: Malva LimesFisher, Donald E, MD Sent: 09/26/2016   1:21 PM To: Malva Limesonald E Fisher, MD Subject: need to check BP and renal last week of July   Since starting losartan-hctz on 7-9

## 2016-10-25 LAB — RENAL FUNCTION PANEL
ALBUMIN: 4.3 g/dL (ref 3.5–5.5)
BUN/Creatinine Ratio: 12 (ref 9–20)
BUN: 17 mg/dL (ref 6–24)
CHLORIDE: 100 mmol/L (ref 96–106)
CO2: 24 mmol/L (ref 20–29)
CREATININE: 1.39 mg/dL — AB (ref 0.76–1.27)
Calcium: 9.3 mg/dL (ref 8.7–10.2)
GFR calc non Af Amer: 56 mL/min/{1.73_m2} — ABNORMAL LOW (ref >59)
GFR, EST AFRICAN AMERICAN: 65 mL/min/{1.73_m2} (ref >59)
Glucose: 152 mg/dL — ABNORMAL HIGH (ref 65–99)
Phosphorus: 3.4 mg/dL (ref 2.5–4.5)
Potassium: 3.9 mmol/L (ref 3.5–5.2)
Sodium: 141 mmol/L (ref 134–144)

## 2016-10-26 ENCOUNTER — Telehealth: Payer: Self-pay

## 2016-10-26 NOTE — Telephone Encounter (Signed)
-----   Message from Malva Limesonald E Fisher, MD sent at 10/25/2016  9:05 AM EDT ----- Labs are good. New BP medication is working well. Continue current medications.  Schedule follow up for BP and diabetes in 6 weeks.

## 2016-10-26 NOTE — Telephone Encounter (Signed)
LMTCB  Thanks,  -Joseline 

## 2016-10-27 NOTE — Telephone Encounter (Signed)
[id number] 

## 2016-10-27 NOTE — Telephone Encounter (Signed)
Pt informed and voiced understanding of results. 

## 2016-11-24 ENCOUNTER — Other Ambulatory Visit: Payer: Self-pay | Admitting: Family Medicine

## 2016-11-24 DIAGNOSIS — E119 Type 2 diabetes mellitus without complications: Secondary | ICD-10-CM

## 2016-11-24 MED ORDER — METFORMIN HCL 500 MG PO TABS: 500.0000 mg | ORAL_TABLET | Freq: Every day | ORAL | 3 refills | Status: DC

## 2016-11-24 NOTE — Telephone Encounter (Signed)
Rx resent.

## 2016-11-24 NOTE — Telephone Encounter (Signed)
Someone from [facility] left voicemail stating that they received the Rx for metFORMIN (GLUCOPHAGE) 500 MG tablet. She stated that pt usually gets 90 day supply because they mail it to pt and the Rx was for 30 day supply with 12 refills. They are requesting that someone call to give a verbal 90 day Rx with 3 refills or to see them another e-script for 90 day supply. Please advise. Thanks TNP

## 2016-12-07 ENCOUNTER — Encounter: Payer: Self-pay | Admitting: *Deleted

## 2016-12-08 ENCOUNTER — Encounter
Admission: RE | Disposition: A | Discharge status: Home / Self Care | Payer: Self-pay | Source: Ambulatory Visit | Attending: Gastroenterology

## 2016-12-08 ENCOUNTER — Encounter: Payer: Self-pay | Admitting: Anesthesiology

## 2016-12-08 ENCOUNTER — Ambulatory Visit: Payer: BLUE CROSS/BLUE SHIELD | Admitting: Anesthesiology

## 2016-12-08 ENCOUNTER — Ambulatory Visit
Admission: RE | Admit: 2016-12-08 | Discharge: 2016-12-08 | Disposition: A | Discharge status: Home / Self Care | Payer: BLUE CROSS/BLUE SHIELD | Source: Ambulatory Visit | Attending: Gastroenterology | Admitting: Gastroenterology

## 2016-12-08 DIAGNOSIS — K219 Gastro-esophageal reflux disease without esophagitis: Secondary | ICD-10-CM | POA: Insufficient documentation

## 2016-12-08 DIAGNOSIS — K5730 Diverticulosis of large intestine without perforation or abscess without bleeding: Secondary | ICD-10-CM | POA: Diagnosis not present

## 2016-12-08 DIAGNOSIS — Z79899 Other long term (current) drug therapy: Secondary | ICD-10-CM | POA: Insufficient documentation

## 2016-12-08 DIAGNOSIS — I10 Essential (primary) hypertension: Secondary | ICD-10-CM | POA: Diagnosis not present

## 2016-12-08 DIAGNOSIS — Z1211 Encounter for screening for malignant neoplasm of colon: Secondary | ICD-10-CM | POA: Diagnosis not present

## 2016-12-08 DIAGNOSIS — K648 Other hemorrhoids: Secondary | ICD-10-CM | POA: Insufficient documentation

## 2016-12-08 DIAGNOSIS — Z800 Family history of malignant neoplasm of digestive organs: Secondary | ICD-10-CM | POA: Diagnosis not present

## 2016-12-08 DIAGNOSIS — K640 First degree hemorrhoids: Secondary | ICD-10-CM | POA: Diagnosis not present

## 2016-12-08 DIAGNOSIS — E119 Type 2 diabetes mellitus without complications: Secondary | ICD-10-CM | POA: Diagnosis not present

## 2016-12-08 DIAGNOSIS — K5790 Diverticulosis of intestine, part unspecified, without perforation or abscess without bleeding: Secondary | ICD-10-CM | POA: Diagnosis not present

## 2016-12-08 HISTORY — DX: Essential (primary) hypertension: I10

## 2016-12-08 HISTORY — PX: COLONOSCOPY WITH PROPOFOL: SHX5780

## 2016-12-08 HISTORY — DX: Type 2 diabetes mellitus without complications: E11.9

## 2016-12-08 LAB — GLUCOSE, CAPILLARY: Glucose-Capillary: 115 mg/dL — ABNORMAL HIGH (ref 65–99)

## 2016-12-08 SURGERY — COLONOSCOPY WITH PROPOFOL
Anesthesia: General

## 2016-12-08 MED ORDER — MIDAZOLAM HCL 2 MG/2ML IJ SOLN
INTRAMUSCULAR | Status: AC
  Filled 2016-12-08: qty 2

## 2016-12-08 MED ORDER — FENTANYL CITRATE (PF) 100 MCG/2ML IJ SOLN
INTRAMUSCULAR | Status: DC | PRN
  Administered 2016-12-08 (×2): 50 ug via INTRAVENOUS

## 2016-12-08 MED ORDER — FENTANYL CITRATE (PF) 100 MCG/2ML IJ SOLN
INTRAMUSCULAR | Status: AC
  Filled 2016-12-08: qty 2

## 2016-12-08 MED ORDER — PROPOFOL 10 MG/ML IV BOLUS
INTRAVENOUS | Status: DC | PRN
Start: 2016-12-08 — End: 2016-12-08

## 2016-12-08 MED ORDER — PROPOFOL 500 MG/50ML IV EMUL
INTRAVENOUS | Status: AC
  Filled 2016-12-08: qty 50

## 2016-12-08 MED ORDER — MIDAZOLAM HCL 5 MG/5ML IJ SOLN
INTRAMUSCULAR | Status: DC | PRN
  Administered 2016-12-08: 2 mg via INTRAVENOUS

## 2016-12-08 MED ORDER — PROPOFOL 500 MG/50ML IV EMUL
INTRAVENOUS | Status: DC | PRN
  Administered 2016-12-08: 120 ug/kg/min via INTRAVENOUS

## 2016-12-08 MED ORDER — SODIUM CHLORIDE 0.9 % IV SOLN
INTRAVENOUS | Status: DC
  Administered 2016-12-08: 09:00:00 via INTRAVENOUS

## 2016-12-08 NOTE — Anesthesia Procedure Notes (Signed)
Performed by: COOK-MARTIN, Magaby Rumberger Pre-anesthesia Checklist: Patient identified, Emergency Drugs available, Suction available, Patient being monitored and Timeout performed Patient Re-evaluated:Patient Re-evaluated prior to induction Oxygen Delivery Method: Nasal cannula Preoxygenation: Pre-oxygenation with 100% oxygen Induction Type: IV induction Placement Confirmation: positive ETCO2 and CO2 detector       

## 2016-12-08 NOTE — Anesthesia Postprocedure Evaluation (Signed)
Anesthesia Post Note  Patient: Benjamin [name]  Procedure(s) Performed: Procedure(s) (LRB): COLONOSCOPY WITH PROPOFOL (N/A)  Patient location during evaluation: Endoscopy Anesthesia Type: General Level of consciousness: awake and alert Pain management: pain level controlled Vital Signs Assessment: post-procedure vital signs reviewed and stable Respiratory status: spontaneous breathing, nonlabored ventilation, respiratory function stable and patient connected to nasal cannula oxygen Cardiovascular status: blood pressure returned to baseline and stable Postop Assessment: no apparent nausea or vomiting Anesthetic complications: no     Last Vitals:  Vitals:   12/08/16 1020 12/08/16 1030  BP: 105/75 123/81  Pulse: (!) 42 (!) 42  Resp: 14 12  Temp:    SpO2: 100% 98%    Last Pain:  Vitals:   12/08/16 0858  TempSrc: Tympanic                 Iyanla Eilers S

## 2016-12-08 NOTE — Op Note (Signed)
[facility] Gastroenterology Patient Name: Benjamin [name] Procedure Date: 12/08/2016 9:30 AM MRN: [id number] Account #: [id number] Date of Birth: 1960/08/08 Admit Type: Outpatient Age: 56 Room: [facility] ENDO ROOM 3 Gender: Male Note Status: Finalized Procedure:            Colonoscopy Indications:          Screening for colorectal malignant neoplasm Providers:            Wyline Mood MD, MD Referring MD:         Demetrios Isaacs. Sherrie Mustache, MD (Referring MD) Medicines:            Monitored Anesthesia Care Complications:        No immediate complications. Procedure:            Pre-Anesthesia Assessment:                       - Prior to the procedure, a History and Physical was                        performed, and patient medications, allergies and                        sensitivities were reviewed. The patient's tolerance of                        previous anesthesia was reviewed.                       - The risks and benefits of the procedure and the                        sedation options and risks were discussed with the                        patient. All questions were answered and informed                        consent was obtained.                       - ASA Grade Assessment: III - A patient with severe                        systemic disease.                       After obtaining informed consent, the colonoscope was                        passed under direct vision. Throughout the procedure,                        the patient's blood pressure, pulse, and oxygen                        saturations were monitored continuously. The                        Colonoscope was introduced through the anus and  advanced to the the cecum, identified by the                        appendiceal orifice, IC valve and transillumination.                        The colonoscopy was performed with ease. The patient                        tolerated the procedure well. The  quality of the bowel                        preparation was good. Findings:      The perianal and digital rectal examinations were normal.      Non-bleeding internal hemorrhoids were found during retroflexion. The       hemorrhoids were small and Grade I (internal hemorrhoids that do not       prolapse).      A few small-mouthed diverticula were found in the right colon.      The exam was otherwise without abnormality on direct and retroflexion       views. Impression:           - Non-bleeding internal hemorrhoids.                       - Diverticulosis in the right colon.                       - The examination was otherwise normal on direct and                        retroflexion views.                       - No specimens collected. Recommendation:       - Discharge patient to home (with escort).                       - Resume previous diet.                       - Continue present medications.                       - Repeat colonoscopy in 10 years for screening purposes. Procedure Code(s):    --- Professional ---                       W0981, Colorectal cancer screening; colonoscopy on                        individual not meeting criteria for high risk Diagnosis Code(s):    --- Professional ---                       Z12.11, Encounter for screening for malignant neoplasm                        of colon                       K57.30, Diverticulosis of large intestine without  perforation or abscess without bleeding                       K64.0, First degree hemorrhoids CPT copyright 2016 American Medical Association. All rights reserved. The codes documented in this report are preliminary and upon coder review may  be revised to meet current compliance requirements. Wyline Mood, MD Wyline Mood MD, MD 12/08/2016 9:59:38 AM This report has been signed electronically. Number of Addenda: 0 Note Initiated On: 12/08/2016 9:30 AM Scope Withdrawal Time: 0 hours 14  minutes 33 seconds  Total Procedure Duration: 0 hours 16 minutes 59 seconds       [facility]

## 2016-12-08 NOTE — Anesthesia Post-op Follow-up Note (Signed)
Anesthesia QCDR form completed.        

## 2016-12-08 NOTE — H&P (Signed)
  Wyline Mood MD [address], [address] [place], [place] [address] Phone: [phone] Fax : [phone]  Primary Care Physician:  Malva Limes, MD Primary Gastroenterologist:  Dr. Wyline Mood   Pre-Procedure History & Physical: HPI:  Benjamin [name] is a 56 y.o. male is here for an colonoscopy.   Past Medical History:  Diagnosis Date  . Diabetes mellitus without complication (HCC)   . Gastrocnemius muscle tear 05/12/2015  . Hypertension     History reviewed. No pertinent surgical history.  Prior to Admission medications   Medication Sig Start Date End Date Taking? Authorizing Provider  acebutolol (SECTRAL) 400 MG capsule Take 1 capsule (400 mg total) by mouth daily. 09/26/16  Yes Malva Limes, MD  allopurinol (ZYLOPRIM) 300 MG tablet Take 1 tablet (300 mg total) by mouth daily. 05/12/15  Yes Lorie Phenix, MD  losartan-hydrochlorothiazide [facility]) 50-12.5 MG tablet Take 1 tablet by mouth every evening. 09/26/16  Yes Malva Limes, MD  metFORMIN (GLUCOPHAGE) 500 MG tablet Take 1 tablet (500 mg total) by mouth daily. 11/24/16  Yes Malva Limes, MD  ranitidine (ZANTAC) 150 MG tablet Take 150 mg by mouth 2 (two) times daily.   Yes [provider]  polyethylene glycol powder (GLYCOLAX/MIRALAX) powder Take 17 g by mouth daily. Patient not taking: Reported on 09/26/2016 03/19/15   Lorie Phenix, MD    Allergies as of 10/24/2016  . (No Known Allergies)    Family History  Problem Relation Age of Onset  . Diabetes Mother   . Heart disease Mother   . Colon cancer Father   . Cancer Sister   . Heart disease Brother   . Diabetes Brother     Social History   Social History  . Marital status: Married    Spouse name: N/A  . Number of children: N/A  . Years of education: N/A   Occupational History  .  [employer]   Social History Main Topics  . Smoking status: Never Smoker  . Smokeless tobacco: Never Used  . Alcohol use No  . Drug use: No  . Sexual  activity: Not on file   Other Topics Concern  . Not on file   Social History Narrative   Is youngest of 74.    Has two sons and one daughter    Review of Systems: See HPI, otherwise negative ROS  Physical Exam: BP (!) 148/87   Pulse (!) 43   Temp (!) 96.2 F (35.7 C) (Tympanic)   Resp 18   Ht  (1.778 m)   Wt 200 lb (90.7 kg)   SpO2 100%   BMI 28.70 kg/m  General:   Alert,  pleasant and cooperative in NAD Head:  Normocephalic and atraumatic. Neck:  Supple; no masses or thyromegaly. Lungs:  Clear throughout to auscultation.    Heart:  Regular rate and rhythm. Abdomen:  Soft, nontender and nondistended. Normal bowel sounds, without guarding, and without rebound.   Neurologic:  Alert and  oriented x4;  grossly normal neurologically.  Impression/Plan: [name] is here for an colonoscopy to be performed for Screening colonoscopy average risk    Risks, benefits, limitations, and alternatives regarding  colonoscopy have been reviewed with the patient.  Questions have been answered.  All parties agreeable.   Wyline Mood, MD  12/08/2016, 9:28 AM

## 2016-12-08 NOTE — Transfer of Care (Signed)
Immediate Anesthesia Transfer of Care Note  Patient: Benjamin [name]  Procedure(s) Performed: Procedure(s): COLONOSCOPY WITH PROPOFOL (N/A)  Patient Location: PACU  Anesthesia Type:General  Level of Consciousness: awake and sedated  Airway & Oxygen Therapy: Patient Spontanous Breathing and Patient connected to nasal cannula oxygen  Post-op Assessment: Report given to RN and Post -op Vital signs reviewed and stable  Post vital signs: Reviewed and stable  Last Vitals:  Vitals:   12/08/16 0858  BP: (!) 148/87  Pulse: (!) 43  Resp: 18  Temp: (!) 35.7 C  SpO2: 100%    Last Pain:  Vitals:   12/08/16 0858  TempSrc: Tympanic         Complications: No apparent anesthesia complications

## 2016-12-08 NOTE — Anesthesia Preprocedure Evaluation (Addendum)
Anesthesia Evaluation  Patient identified by MRN, date of birth, ID band Patient awake    Reviewed: Allergy & Precautions, NPO status , Patient's Chart, lab work & pertinent test results, reviewed documented beta blocker date and time   Airway Mallampati: II  TM Distance: >3 FB     Dental  (+) Chipped, Missing   Pulmonary           Cardiovascular hypertension,      Neuro/Psych    GI/Hepatic GERD  ,  Endo/Other  diabetes, Type 2  Renal/GU      Musculoskeletal   Abdominal   Peds  Hematology   Anesthesia Other Findings   Reproductive/Obstetrics                            Anesthesia Physical Anesthesia Plan  ASA: III  Anesthesia Plan: General   Post-op Pain Management:    Induction: Intravenous  PONV Risk Score and Plan:   Airway Management Planned:   Additional Equipment:   Intra-op Plan:   Post-operative Plan:   Informed Consent: I have reviewed the patients History and Physical, chart, labs and discussed the procedure including the risks, benefits and alternatives for the proposed anesthesia with the patient or authorized representative who has indicated his/her understanding and acceptance.     Plan Discussed with: CRNA  Anesthesia Plan Comments:         Anesthesia Quick Evaluation

## 2016-12-09 ENCOUNTER — Encounter: Payer: Self-pay | Admitting: Gastroenterology

## 2016-12-27 ENCOUNTER — Ambulatory Visit (INDEPENDENT_AMBULATORY_CARE_PROVIDER_SITE_OTHER): Payer: BLUE CROSS/BLUE SHIELD | Admitting: Family Medicine

## 2016-12-27 ENCOUNTER — Encounter: Payer: Self-pay | Admitting: Family Medicine

## 2016-12-27 VITALS — BP 132/82 | HR 47 | Temp 97.9°F | Resp 16 | Wt 210.0 lb

## 2016-12-27 DIAGNOSIS — E119 Type 2 diabetes mellitus without complications: Secondary | ICD-10-CM | POA: Diagnosis not present

## 2016-12-27 DIAGNOSIS — I10 Essential (primary) hypertension: Secondary | ICD-10-CM | POA: Diagnosis not present

## 2016-12-27 DIAGNOSIS — Z23 Encounter for immunization: Secondary | ICD-10-CM

## 2016-12-27 DIAGNOSIS — E790 Hyperuricemia without signs of inflammatory arthritis and tophaceous disease: Secondary | ICD-10-CM | POA: Diagnosis not present

## 2016-12-27 DIAGNOSIS — M67431 Ganglion, right wrist: Secondary | ICD-10-CM

## 2016-12-27 LAB — POCT UA - MICROALBUMIN: MICROALBUMIN (UR) POC: 20 mg/L

## 2016-12-27 LAB — POCT GLYCOSYLATED HEMOGLOBIN (HGB A1C)
Est. average glucose Bld gHb Est-mCnc: 134
Hemoglobin A1C: 6.3

## 2016-12-27 MED ORDER — ACEBUTOLOL HCL 400 MG PO CAPS: 400.0000 mg | ORAL_CAPSULE | Freq: Every day | ORAL | 3 refills | Status: DC

## 2016-12-27 MED ORDER — ALLOPURINOL 300 MG PO TABS: 300.0000 mg | ORAL_TABLET | Freq: Every day | ORAL | 3 refills | Status: DC

## 2016-12-27 MED ORDER — LOSARTAN POTASSIUM-HCTZ 50-12.5 MG PO TABS: 1.0000 | ORAL_TABLET | Freq: Every evening | ORAL | 3 refills | Status: DC

## 2016-12-27 NOTE — Addendum Note (Signed)
Addended by: Malva Limes on: 12/27/2016 09:14 AM   Modules accepted: Level of Service

## 2016-12-27 NOTE — Patient Instructions (Signed)
Ganglion Cyst A ganglion cyst is a noncancerous, fluid-filled lump that occurs near joints or tendons. The ganglion cyst grows out of a joint or the lining of a tendon. It most often develops in the hand or wrist, but it can also develop in the shoulder, elbow, hip, knee, ankle, or foot. The round or oval ganglion cyst can be the size of a pea or larger than a grape. Increased activity may enlarge the size of the cyst because more fluid starts to build up. What are the causes? It is not known what causes a ganglion cyst to grow. However, it may be related to:  Inflammation or irritation around the joint.  An injury.  Repetitive movements or overuse.  Arthritis.  What increases the risk? Risk factors include:  Being a woman.  Being age 20-50.  What are the signs or symptoms? Symptoms may include:  A lump. This most often appears on the hand or wrist, but it can occur in other areas of the body.  Tingling.  Pain.  Numbness.  Muscle weakness.  Weak grip.  Less movement in a joint.  How is this diagnosed? Ganglion cysts are most often diagnosed based on a physical exam. Your health care provider will feel the lump and may shine a light alongside it. If it is a ganglion cyst, a light often shines through it. Your health care provider may order an X-ray, ultrasound, or MRI to rule out other conditions. How is this treated? Ganglion cysts usually go away on their own without treatment. If pain or other symptoms are involved, treatment may be needed. Treatment is also needed if the ganglion cyst limits your movement or if it gets infected. Treatment may include:  Wearing a brace or splint on your wrist or finger.  Taking anti-inflammatory medicine.  Draining fluid from the lump with a needle (aspiration).  Injecting a steroid into the joint.  Surgery to remove the ganglion cyst.  Follow these instructions at home:  Do not press on the ganglion cyst, poke it with a  needle, or hit it.  Take medicines only as directed by your health care provider.  Wear your brace or splint as directed by your health care provider.  Watch your ganglion cyst for any changes.  Keep all follow-up visits as directed by your health care provider. This is important. Contact a health care provider if:  Your ganglion cyst becomes larger or more painful.  You have increased redness, red streaks, or swelling.  You have pus coming from the lump.  You have weakness or numbness in the affected area.  You have a fever or chills. This information is not intended to replace advice given to you by your health care provider. Make sure you discuss any questions you have with your health care provider. Document Released: 03/04/2000 Document Revised: 08/13/2015 Document Reviewed: 08/20/2013 Elsevier Interactive Patient Education  2018 Elsevier Inc.  

## 2016-12-27 NOTE — Progress Notes (Signed)
Patient: Benjamin [name] Male    DOB: 05-Aug-1960   56 y.o.   MRN: [id number] Visit Date: 12/27/2016  Today's Provider: Mila Merry, MD   Chief Complaint  Patient presents with  . Hypertension    follow up  . Diabetes    follow up   Subjective:    HPI  Hypertension, follow-up:  BP Readings from Last 3 Encounters:  12/08/16 123/81  10/24/16 118/74  09/26/16 (!) 144/84    He was last seen for hypertension 3 months ago.  BP at that visit was 144/84. Management since that visit includes reducing Acebutolol to  daily and changing Indapamide to Losartan- HCTZ 50-12.5mg  daily. He reports good compliance with treatment. He is not having side effects.  He is exercising. He is adherent to low salt diet.   Outside blood pressures are 130/80. He is experiencing none.  Patient denies chest pain, chest pressure/discomfort, claudication, dyspnea, exertional chest pressure/discomfort, fatigue, irregular heart beat, lower extremity edema, near-syncope, orthopnea, palpitations, paroxysmal nocturnal dyspnea, syncope and tachypnea.   Cardiovascular risk factors include advanced age (older than 61 for men, 20 for women), diabetes mellitus, hypertension and male gender.  Use of agents associated with hypertension: none.     Weight trend: increasing steadily Wt Readings from Last 3 Encounters:  12/08/16 200 lb (90.7 kg)  09/26/16 218 lb (98.9 kg)  03/23/16 208 lb (94.3 kg)    Current diet: well balanced  ------------------------------------------------------------------------  Diabetes Mellitus Type II, Follow-up:   Lab Results  Component Value Date   HGBA1C 7.8 09/26/2016   HGBA1C 6.4 (H) 03/23/2016   HGBA1C 6.1 (H) 04/10/2015    Last seen for diabetes 3 months ago.  Management since then includes starting Metformin  daily. He reports good compliance with treatment. He is not having side effects.  Current symptoms include none and have been stable. Home  blood sugar records: fasting range: 100-120  Episodes of hypoglycemia? no   Current Insulin Regimen: none Most Recent Eye Exam: 06/2016 Weight trend: increasing steadily Prior visit with dietician: no Current diet: well balanced Current exercise: walking  Pertinent Labs:    Component Value Date/Time   CHOL 176 03/23/2016 0958   TRIG 84 03/23/2016 0958   HDL 46 03/23/2016 0958   LDLCALC 113 (H) 03/23/2016 0958   CREATININE 1.39 (H) 10/24/2016 1512    Wt Readings from Last 3 Encounters:  12/08/16 200 lb (90.7 kg)  09/26/16 218 lb (98.9 kg)  03/23/16 208 lb (94.3 kg)    ------------------------------------------------------------------------     No Known Allergies   Current Outpatient Prescriptions:  .  acebutolol (SECTRAL) 400 MG capsule, Take 1 capsule (400 mg total) by mouth daily., Disp: 1 capsule, Rfl: 1 .  allopurinol (ZYLOPRIM) 300 MG tablet, Take 1 tablet (300 mg total) by mouth daily., Disp: 90 tablet, Rfl: 3 .  losartan-hydrochlorothiazide (HYZAAR) 50-12.5 MG tablet, Take 1 tablet by mouth every evening., Disp: 90 tablet, Rfl: 1 .  metFORMIN (GLUCOPHAGE) 500 MG tablet, Take 1 tablet (500 mg total) by mouth daily., Disp: 90 tablet, Rfl: 3 .  polyethylene glycol powder (GLYCOLAX/MIRALAX) powder, Take 17 g by mouth daily., Disp: 3350 g, Rfl: 1 .  ranitidine (ZANTAC) 150 MG tablet, Take 150 mg by mouth 2 (two) times daily., Disp: , Rfl:   Review of Systems  Constitutional: Negative for appetite change, chills and fever.  Respiratory: Negative for chest tightness, shortness of breath and wheezing.   Cardiovascular: Negative for chest pain  and palpitations.  Gastrointestinal: Negative for abdominal pain, nausea and vomiting.    Social History  Substance Use Topics  . Smoking status: Never Smoker  . Smokeless tobacco: Never Used  . Alcohol use No   Objective:    Vitals:   12/27/16 0832 12/27/16 0850  BP: 140/88 132/82  Pulse: (!) 47   Resp: 16   Temp:  97.9 F (36.6 C)   TempSrc: Oral   SpO2: 98%   Weight: 210 lb (95.3 kg)       Physical Exam   General Appearance:    Alert, cooperative, no distress  Eyes:    PERRL, conjunctiva/corneas clear, EOM's intact       Lungs:     Clear to auscultation bilaterally, respirations unlabored  Heart:    Regular rate and rhythm  Neurologic:   Awake, alert, oriented x 3. No apparent focal neurological           defect.   MS:     Pea sized ganglion cyst of volar aspect right lateral wrist, non tender, non erythematous.     Results for orders placed or performed in visit on 12/27/16  POCT HgB A1C  Result Value Ref Range   Hemoglobin A1C 6.3    Est. average glucose Bld gHb Est-mCnc 134   POCT UA - Microalbumin  Result Value Ref Range   Microalbumin Ur, POC 20 mg/L   Creatinine, POC n/a mg/dL   Albumin/Creatinine Ratio, Urine, POC n/a        Assessment & Plan:     1. Diabetes mellitus without complication (HCC) Well controlled.  Continue current medications.   - POCT HgB A1C - POCT UA - Microalbumin  2. Essential (primary) hypertension Well controlled.  Continue current medications.   - acebutolol (SECTRAL) 400 MG capsule; Take 1 capsule (400 mg total) by mouth daily.  Dispense: 90 capsule; Refill: 3 - losartan-hydrochlorothiazide (HYZAAR) 50-12.5 MG tablet; Take 1 tablet by mouth every evening.  Dispense: 90 tablet; Refill: 3  3. Ganglion cyst of wrist, right  4. Hyperuricemia No gout flares since being on  - allopurinol (ZYLOPRIM) 300 MG tablet; Take 1 tablet (300 mg total) by mouth daily.  Dispense: 90 tablet; Refill: 3  5. Need for influenza vaccination  - Flu Vaccine QUAD 6+ mos PF IM (Fluarix Quad PF)  Return in about 3 months (around 03/29/2017) for Yearly Physical.       Mila Merry, MD  [facility]

## 2017-03-29 ENCOUNTER — Other Ambulatory Visit: Payer: Self-pay

## 2017-03-29 ENCOUNTER — Ambulatory Visit (INDEPENDENT_AMBULATORY_CARE_PROVIDER_SITE_OTHER): Payer: BLUE CROSS/BLUE SHIELD | Admitting: Family Medicine

## 2017-03-29 ENCOUNTER — Encounter: Payer: Self-pay | Admitting: Family Medicine

## 2017-03-29 VITALS — BP 120/80 | HR 46 | Temp 98.1°F | Resp 16 | Ht 70.0 in | Wt 211.0 lb

## 2017-03-29 DIAGNOSIS — Z125 Encounter for screening for malignant neoplasm of prostate: Secondary | ICD-10-CM | POA: Diagnosis not present

## 2017-03-29 DIAGNOSIS — I10 Essential (primary) hypertension: Secondary | ICD-10-CM | POA: Diagnosis not present

## 2017-03-29 DIAGNOSIS — Z0000 Encounter for general adult medical examination without abnormal findings: Secondary | ICD-10-CM | POA: Diagnosis not present

## 2017-03-29 DIAGNOSIS — E119 Type 2 diabetes mellitus without complications: Secondary | ICD-10-CM | POA: Diagnosis not present

## 2017-03-29 DIAGNOSIS — Z6830 Body mass index (BMI) 30.0-30.9, adult: Secondary | ICD-10-CM | POA: Diagnosis not present

## 2017-03-29 DIAGNOSIS — E790 Hyperuricemia without signs of inflammatory arthritis and tophaceous disease: Secondary | ICD-10-CM

## 2017-03-29 NOTE — Progress Notes (Signed)
Patient: Benjamin [name], Male    DOB: Feb 24, 1961, 57 y.o.   MRN: [id number] Visit Date: 03/29/2017  Today's Provider: Mila Merry, MD   Chief Complaint  Patient presents with  . Annual Exam  . Diabetes  . Hypertension   Subjective:    Annual physical exam Benjamin [name] is a 57 y.o. male who presents today for health maintenance and complete physical. He feels well. He reports exercising yes/walking. He reports he is sleeping well.  -----------------------------------------------------------------  Diabetes Mellitus Type II, Follow-up:   Lab Results  Component Value Date   HGBA1C 6.3 12/27/2016   HGBA1C 7.8 09/26/2016   HGBA1C 6.4 (H) 03/23/2016   Last seen for diabetes 3 months ago.  Management since then includes; no changes. He reports good compliance with treatment. He is not having side effects. none Current symptoms include none and have been unchanged. Home blood sugar records: fasting range: 100-135  Episodes of hypoglycemia? no   Current Insulin Regimen: n/a Most Recent Eye Exam: April 2018. Has appt scheduled April 2019.  Weight trend: stable Prior visit with dietician: no Current diet: well balanced Current exercise: walking  ----------------------------------------------------------------    Hypertension, follow-up:  BP Readings from Last 3 Encounters:  03/29/17 120/80  12/27/16 132/82  12/08/16 123/81    He was last seen for hypertension 3 months ago.  BP at that visit was 140/88. Management since that visit includes; no changes.He reports good compliance with treatment. He is not having side effects. none He is exercising. He is adherent to low salt diet.   Outside blood pressures are 130/88. He is experiencing none.  Patient denies none.   Cardiovascular risk factors include diabetes mellitus.  Use of agents associated with hypertension: none.    ----------------------------------------------------------------   Hyperuricemia From 12/27/2016-no changes. Stable.  Lab Results  Component Value Date   LABURIC 5.9 04/10/2015   Has has no trouble with gout since being on allopurinol.    Review of Systems  Constitutional: Negative for appetite change, chills and fever.  Respiratory: Negative for chest tightness, shortness of breath and wheezing.   Cardiovascular: Negative for chest pain and palpitations.  Gastrointestinal: Negative for abdominal pain, nausea and vomiting.    Social History      He  reports that  has never smoked. he has never used smokeless tobacco. He reports that he does not drink alcohol or use drugs.       Social History   Socioeconomic History  . Marital status: Married    Spouse name: None  . Number of children: None  . Years of education: None  . Highest education level: None  Social Needs  . Financial resource strain: None  . Food insecurity - worry: None  . Food insecurity - inability: None  . Transportation needs - medical: None  . Transportation needs - non-medical: None  Occupational History    Employer: [employer]  Tobacco Use  . Smoking status: Never Smoker  . Smokeless tobacco: Never Used  Substance and Sexual Activity  . Alcohol use: No  . Drug use: No  . Sexual activity: None  Other Topics Concern  . None  Social History Narrative   Is youngest of 52.    Has two sons and one daughter    Past Medical History:  Diagnosis Date  . Diabetes mellitus without complication (HCC)   . Gastrocnemius muscle tear 05/12/2015  . Hypertension      Patient Active Problem List  Diagnosis Date Noted  . Ganglion cyst of wrist, right 12/27/2016  . GERD (gastroesophageal reflux disease) 09/26/2016  . Diabetes mellitus without complication (HCC) 03/23/2016  . Hyperuricemia 03/23/2016  . CN (constipation) 03/13/2015  . History of migraine headaches 03/13/2015  . Allergic rhinitis  01/02/2015  . Essential (primary) hypertension 01/02/2015  . Personal history of gout 01/02/2015    Past Surgical History:  Procedure Laterality Date  . COLONOSCOPY WITH PROPOFOL N/A 12/08/2016   Procedure: COLONOSCOPY WITH PROPOFOL;  Surgeon: Wyline MoodAnna, Kiran, MD;  Location[facility];  Service: Gastroenterology;  Laterality: N/A;    Family History        Family Status  Relation Name Status  . Mother  Deceased       stroke  . Father  Deceased at age 58       cancer  . Sister  Alive  . Brother  Alive  . Son  Alive  . Son  Alive  . Daughter  Alive        His family history includes Cancer in his sister; Colon cancer in his father; Diabetes in his brother and mother; Heart disease in his brother and mother.     No Known Allergies   Current Outpatient Medications:  .  acebutolol (SECTRAL) 400 MG capsule, Take 1 capsule (400 mg total) by mouth daily., Disp: 90 capsule, Rfl: 3 .  allopurinol (ZYLOPRIM) 300 MG tablet, Take 1 tablet (300 mg total) by mouth daily., Disp: 90 tablet, Rfl: 3 .  losartan-hydrochlorothiazide (HYZAAR) 50-12.5 MG tablet, Take 1 tablet by mouth every evening., Disp: 90 tablet, Rfl: 3 .  metFORMIN (GLUCOPHAGE) 500 MG tablet, Take 1 tablet (500 mg total) by mouth daily., Disp: 90 tablet, Rfl: 3 .  polyethylene glycol powder (GLYCOLAX/MIRALAX) powder, Take 17 g by mouth daily., Disp: 3350 g, Rfl: 1 .  ranitidine (ZANTAC) 150 MG tablet, Take 150 mg by mouth 2 (two) times daily., Disp: , Rfl:    Patient Care Team: Malva LimesFisher, Donald E, MD as PCP - General (Family Medicine)      Objective:   Vitals: BP 120/80 (BP Location: Right Arm, Patient Position: Sitting, Cuff Size: Large)   Pulse (!) 46   Temp 98.1 F (36.7 C) (Oral)   Resp 16   Ht 5\' 10"  (1.778 m)   Wt 211 lb (95.7 kg)   SpO2 99%   BMI 30.28 kg/m    Vitals:   03/29/17 0914  BP: 120/80  Pulse: (!) 46  Resp: 16  Temp: 98.1 F (36.7 C)  TempSrc: Oral  SpO2: 99%  Weight: 211 lb (95.7 kg)   Height: 5\' 10"  (1.778 m)     Physical Exam   General Appearance:    Alert, cooperative, no distress, appears stated age  Head:    Normocephalic, without obvious abnormality, atraumatic  Eyes:    PERRL, conjunctiva/corneas clear, EOM's intact, fundi    benign, both eyes       Ears:    Normal TM's and external ear canals, both ears  Nose:   Nares normal, septum midline, mucosa normal, no drainage   or sinus tenderness  Throat:   Lips, mucosa, and tongue normal; teeth and gums normal  Neck:   Supple, symmetrical, trachea midline, no adenopathy;       thyroid:  No enlargement/tenderness/nodules; no carotid   bruit or JVD  Back:     Symmetric, no curvature, ROM normal, no CVA tenderness  Lungs:     Clear to auscultation bilaterally,  respirations unlabored  Chest wall:    No tenderness or deformity  Heart:    Regular rate and rhythm, S1 and S2 normal, no murmur, rub   or gallop  Abdomen:     Soft, non-tender, bowel sounds active all four quadrants,    no masses, no organomegaly  Genitalia:    deferred  Rectal:    deferred  Extremities:   Extremities normal, atraumatic, no cyanosis or edema  Pulses:   2+ and symmetric all extremities  Skin:   Skin color, texture, turgor normal, no rashes or lesions  Lymph nodes:   Cervical, supraclavicular, and axillary nodes normal  Neurologic:   CNII-XII intact. Normal strength, sensation and reflexes      throughout    Depression Screen PHQ 2/9 Scores 03/29/2017 09/26/2016 03/19/2015  PHQ - 2 Score 0 0 0  PHQ- 9 Score 0 0 -      Assessment & Plan:     Routine Health Maintenance and Physical Exam  Exercise Activities and Dietary recommendations Goals    . Exercise 150 minutes per week (moderate activity)       Immunization History  Administered Date(s) Administered  . Influenza,inj,Quad PF,6+ Mos 12/27/2016  . Td 05/20/2013  . Tdap 05/20/2013    Health Maintenance  Topic Date Due  . PNEUMOCOCCAL POLYSACCHARIDE VACCINE (1)  08/03/1961  . OPHTHALMOLOGY EXAM  08/03/1969  . HIV Screening  08/04/1974  . HEMOGLOBIN A1C  06/27/2017  . FOOT EXAM  09/26/2017  . TETANUS/TDAP  05/21/2023  . COLONOSCOPY  12/09/2026  . INFLUENZA VACCINE  Completed  . Hepatitis C Screening  Completed     Discussed health benefits of physical activity, and encouraged him to engage in regular exercise appropriate for his age and condition.    --------------------------------------------------------------------  1. Annual physical exam Discussed Shingrix and  - PSA - Comprehensive metabolic panel - Lipid panel  2. Essential (primary) hypertension Well controlled.  Continue current medications.   - Comprehensive metabolic panel  3. Diabetes mellitus without complication (HCC) Well controlled.  Continue current medications.   - Lipid panel - Hemoglobin A1c  4. Hyperuricemia Gout completely controlled.   5. Prostate cancer screening  - PSA  6. Overweight Counseled regarding prudent diet and regular exercise.   Return in about 6 months (around 09/26/2017).   Mila Merry, MD  [facility]

## 2017-03-29 NOTE — Patient Instructions (Addendum)
I recommend that you get the Pneumovax-23 vaccine to protect yourself from certain strains of pneumonia. Please call our office at [phone] at your earliest convenience to schedule this vaccine.     The CDC recommends two doses of Shingrix (the shingles vaccine) separated by 2 to 6 months for adults age 57 years and older. I recommend checking with your insurance plan regarding coverage for this vaccine.      Preventive Care 40-64 Years, Male Preventive care refers to lifestyle choices and visits with your health care provider that can promote health and wellness. What does preventive care include?  A yearly physical exam. This is also called an annual well check.  Dental exams once or twice a year.  Routine eye exams. Ask your health care provider how often you should have your eyes checked.  Personal lifestyle choices, including: ? Daily care of your teeth and gums. ? Regular physical activity. ? Eating a healthy diet. ? Avoiding tobacco and drug use. ? Limiting alcohol use. ? Practicing safe sex. ? Taking low-dose aspirin every day starting at age 58. What happens during an annual well check? The services and screenings done by your health care provider during your annual well check will depend on your age, overall health, lifestyle risk factors, and family history of disease. Counseling Your health care provider may ask you questions about your:  Alcohol use.  Tobacco use.  Drug use.  Emotional well-being.  Home and relationship well-being.  Sexual activity.  Eating habits.  Work and work Statistician.  Screening You may have the following tests or measurements:  Height, weight, and BMI.  Blood pressure.  Lipid and cholesterol levels. These may be checked every 5 years, or more frequently if you are over 47 years old.  Skin check.  Lung cancer screening. You may have this screening every year starting at age 37 if you have a 30-pack-year history of  smoking and currently smoke or have quit within the past 15 years.  Fecal occult blood test (FOBT) of the stool. You may have this test every year starting at age 3.  Flexible sigmoidoscopy or colonoscopy. You may have a sigmoidoscopy every 5 years or a colonoscopy every 10 years starting at age 42.  Prostate cancer screening. Recommendations will vary depending on your family history and other risks.  Hepatitis C blood test.  Hepatitis B blood test.  Sexually transmitted disease (STD) testing.  Diabetes screening. This is done by checking your blood sugar (glucose) after you have not eaten for a while (fasting). You may have this done every 1-3 years.  Discuss your test results, treatment options, and if necessary, the need for more tests with your health care provider. Vaccines Your health care provider may recommend certain vaccines, such as:  Influenza vaccine. This is recommended every year.  Tetanus, diphtheria, and acellular pertussis (Tdap, Td) vaccine. You may need a Td booster every 10 years.  Varicella vaccine. You may need this if you have not been vaccinated.  Zoster vaccine. You may need this after age 38.  Measles, mumps, and rubella (MMR) vaccine. You may need at least one dose of MMR if you were born in 1957 or later. You may also need a second dose.  Pneumococcal 13-valent conjugate (PCV13) vaccine. You may need this if you have certain conditions and have not been vaccinated.  Pneumococcal polysaccharide (PPSV23) vaccine. You may need one or two doses if you smoke cigarettes or if you have certain conditions.  Meningococcal vaccine. You may need this if you have certain conditions.  Hepatitis A vaccine. You may need this if you have certain conditions or if you travel or work in places where you may be exposed to hepatitis A.  Hepatitis B vaccine. You may need this if you have certain conditions or if you travel or work in places where you may be exposed to  hepatitis B.  Haemophilus influenzae type b (Hib) vaccine. You may need this if you have certain risk factors.  Talk to your health care provider about which screenings and vaccines you need and how often you need them. This information is not intended to replace advice given to you by your health care provider. Make sure you discuss any questions you have with your health care provider. Document Released: 04/03/2015 Document Revised: 11/25/2015 Document Reviewed: 01/06/2015 Elsevier Interactive Patient Education  [employer].

## 2017-03-30 LAB — LIPID PANEL
CHOL/HDL RATIO: 3.1 ratio (ref 0.0–5.0)
Cholesterol, Total: 156 mg/dL (ref 100–199)
HDL: 51 mg/dL (ref >39)
LDL Calculated: 86 mg/dL (ref 0–99)
Triglycerides: 93 mg/dL (ref 0–149)
VLDL Cholesterol Cal: 19 mg/dL (ref 5–40)

## 2017-03-30 LAB — COMPREHENSIVE METABOLIC PANEL
A/G RATIO: 1.4 (ref 1.2–2.2)
ALT: 44 IU/L (ref 0–44)
AST: 31 IU/L (ref 0–40)
Albumin: 4.5 g/dL (ref 3.5–5.5)
Alkaline Phosphatase: 64 IU/L (ref 39–117)
BUN/Creatinine Ratio: 16 (ref 9–20)
BUN: 16 mg/dL (ref 6–24)
Bilirubin Total: 0.7 mg/dL (ref 0.0–1.2)
CALCIUM: 9.2 mg/dL (ref 8.7–10.2)
CO2: 22 mmol/L (ref 20–29)
Chloride: 101 mmol/L (ref 96–106)
Creatinine, Ser: 1.03 mg/dL (ref 0.76–1.27)
GFR, EST AFRICAN AMERICAN: 93 mL/min/{1.73_m2} (ref >59)
GFR, EST NON AFRICAN AMERICAN: 80 mL/min/{1.73_m2} (ref >59)
Globulin, Total: 3.3 g/dL (ref 1.5–4.5)
Glucose: 112 mg/dL — ABNORMAL HIGH (ref 65–99)
POTASSIUM: 3.9 mmol/L (ref 3.5–5.2)
Sodium: 138 mmol/L (ref 134–144)
TOTAL PROTEIN: 7.8 g/dL (ref 6.0–8.5)

## 2017-03-30 LAB — HEMOGLOBIN A1C
Est. average glucose Bld gHb Est-mCnc: 140 mg/dL
HEMOGLOBIN A1C: 6.5 % — AB (ref 4.8–5.6)

## 2017-03-30 LAB — PSA: PROSTATE SPECIFIC AG, SERUM: 0.6 ng/mL (ref 0.0–4.0)

## 2017-08-31 LAB — HM DIABETES EYE EXAM

## 2017-09-01 ENCOUNTER — Encounter: Payer: Self-pay | Admitting: *Deleted

## 2017-09-26 ENCOUNTER — Ambulatory Visit: Payer: BLUE CROSS/BLUE SHIELD | Admitting: Family Medicine

## 2017-09-26 ENCOUNTER — Encounter: Payer: Self-pay | Admitting: Family Medicine

## 2017-09-26 VITALS — BP 130/80 | HR 48 | Temp 97.9°F | Resp 16 | Wt 216.4 lb

## 2017-09-26 DIAGNOSIS — I10 Essential (primary) hypertension: Secondary | ICD-10-CM | POA: Diagnosis not present

## 2017-09-26 DIAGNOSIS — E119 Type 2 diabetes mellitus without complications: Secondary | ICD-10-CM

## 2017-09-26 NOTE — Progress Notes (Signed)
Patient: Benjamin [name] Male    DOB: 09-Aug-1960   57 y.o.   MRN: [id number] Visit Date: 09/26/2017  Today's Provider: Mila Merry, MD   Chief Complaint  Patient presents with  . Follow-up    6 months follow-up HTN,Diabetes,Hyperlipidemia.   Subjective:    HPI  Diabetes Mellitus Type II, Follow-up:   Lab Results  Component Value Date   HGBA1C 6.5 (H) 03/29/2017   HGBA1C 6.3 12/27/2016   HGBA1C 7.8 09/26/2016   Last seen for diabetes 6 months ago.  Management since then includes no changes. He reports excellent compliance with treatment. He is not having side effects.  Current symptoms include none and have been stable. Home blood sugar records: 120's-140's  Episodes of hypoglycemia? no   Current Insulin Regimen: n/a Most Recent Eye Exam: 08/2017 at [facility] Weight trend: stable Prior visit with dietician: no Current diet: well balanced Current exercise: walking  ------------------------------------------------------------------------   Hypertension, follow-up:  BP Readings from Last 3 Encounters:  09/26/17 130/80  03/29/17 120/80  12/27/16 132/82    He was last seen for hypertension 6 months ago.  BP at that visit was 120/80. Management since that visit includes no changes.He reports excellent compliance with treatment. He is not having side effects.  He is exercising. He is adherent to low salt diet.   Outside blood pressures are 140's-130's/80's He is experiencing none.  Patient denies chest pain, chest pressure/discomfort, exertional chest pressure/discomfort, fatigue, irregular heart beat, lower extremity edema, near-syncope and palpitations.   Cardiovascular risk factors include advanced age (older than 90 for men, 9 for women), diabetes mellitus, dyslipidemia, hypertension and male gender.  Use of agents associated with hypertension: none.    ------------------------------------------------------------------------    Lipid/Cholesterol, Follow-up:   Last seen for this 6 months ago.  Management since that visit includes start Pravastatin since his cholesterol was high. Patient declined prescription and requested to work on diet and exercise and see how his labs are this time.  Last Lipid Panel:    Component Value Date/Time   CHOL 156 03/29/2017 0954   TRIG 93 03/29/2017 0954   HDL 51 03/29/2017 0954   CHOLHDL 3.1 03/29/2017 0954   LDLCALC 86 03/29/2017 0954     Wt Readings from Last 3 Encounters:  09/26/17 216 lb 6.4 oz (98.2 kg)  03/29/17 211 lb (95.7 kg)  12/27/16 210 lb (95.3 kg)    ------------------------------------------------------------------------     No Known Allergies   Current Outpatient Medications:  .  acebutolol (SECTRAL) 400 MG capsule, Take 1 capsule (400 mg total) by mouth daily., Disp: 90 capsule, Rfl: 3 .  allopurinol (ZYLOPRIM) 300 MG tablet, Take 1 tablet (300 mg total) by mouth daily., Disp: 90 tablet, Rfl: 3 .  losartan-hydrochlorothiazide (HYZAAR) 50-12.5 MG tablet, Take 1 tablet by mouth every evening., Disp: 90 tablet, Rfl: 3 .  metFORMIN (GLUCOPHAGE) 500 MG tablet, Take 1 tablet (500 mg total) by mouth daily., Disp: 90 tablet, Rfl: 3 .  polyethylene glycol powder (GLYCOLAX/MIRALAX) powder, Take 17 g by mouth daily., Disp: 3350 g, Rfl: 1 .  ranitidine (ZANTAC) 150 MG tablet, Take 150 mg by mouth 2 (two) times daily., Disp: , Rfl:   Review of Systems  Constitutional: Negative for fatigue.  Eyes: Negative for visual disturbance.  Respiratory: Negative for shortness of breath.   Cardiovascular: Negative for chest pain and palpitations. Leg swelling: "ankle swelling off and on.  Neurological: Negative for dizziness, light-headedness and headaches.  Social History   Tobacco Use  . Smoking status: Never Smoker  . Smokeless tobacco: Never Used  Substance Use Topics  . Alcohol  use: No   Objective:   BP 130/80 (BP Location: Right Arm, Patient Position: Sitting, Cuff Size: Normal)   Pulse (!) 48   Temp 97.9 F (36.6 C) (Oral)   Resp 16   Wt 216 lb 6.4 oz (98.2 kg)   SpO2 98%   BMI 31.05 kg/m    Physical Exam  General Appearance:    Alert, cooperative, no distress, oveweight  Eyes:    PERRL, conjunctiva/corneas clear, EOM's intact       Lungs:     Clear to auscultation bilaterally, respirations unlabored  Heart:    Regular rate and rhythm  Neurologic:   Awake, alert, oriented x 3. No apparent focal neurological           defect.           Assessment & Plan:     1. Diabetes mellitus without complication (HCC) Doing well on current medications. He is willing to take statin. Will see how lipids look.  - Lipid panel - Hemoglobin A1c  2. Essential (primary) hypertension Well controlled.  Continue current medications.            Mila Merryonald Fisher, MD  [facility]

## 2017-09-27 LAB — LIPID PANEL
CHOLESTEROL TOTAL: 168 mg/dL (ref 100–199)
Chol/HDL Ratio: 3.7 ratio (ref 0.0–5.0)
HDL: 45 mg/dL (ref >39)
LDL CALC: 92 mg/dL (ref 0–99)
Triglycerides: 153 mg/dL — ABNORMAL HIGH (ref 0–149)
VLDL CHOLESTEROL CAL: 31 mg/dL (ref 5–40)

## 2017-09-27 LAB — HEMOGLOBIN A1C
Est. average glucose Bld gHb Est-mCnc: 143 mg/dL
Hgb A1c MFr Bld: 6.6 % — ABNORMAL HIGH (ref 4.8–5.6)

## 2017-09-28 ENCOUNTER — Telehealth: Payer: Self-pay | Admitting: Family Medicine

## 2017-09-28 ENCOUNTER — Telehealth: Payer: Self-pay

## 2017-09-28 MED ORDER — PRAVASTATIN SODIUM 40 MG PO TABS: 40.0000 mg | ORAL_TABLET | Freq: Every day | ORAL | 5 refills | Status: DC

## 2017-09-28 NOTE — Telephone Encounter (Signed)
Deanna with [facility] is requesting a verbal order to change the Rx for pravastatin (PRAVACHOL) 40 MG tablet that they received today to 90 day supply with 1 refill. Deanna stated that they mail the medication to pt so they would like a call back asap and stated if the Rx is for a 90 day supply pt won't have a co-pay. Please advise. Thanks TNP

## 2017-09-28 NOTE — Telephone Encounter (Signed)
That's fine, they can change to a 90 day supply with 1 refill

## 2017-09-28 NOTE — Telephone Encounter (Signed)
-----   Message from Malva Limesonald E Fisher, MD sent at 09/27/2017  8:09 AM EDT ----- LDL cholesterol is up to 92. Needs to be under 70 to protect heart from diabetes. Start pravastatin 40mg  once a day, #30, rf x 5.  a1c is stable at 6.6% continue all other medications unchanged. Schedule Follow up 4 months to check a1c and lipids.

## 2017-09-28 NOTE — Telephone Encounter (Signed)
Pharmacy advised  

## 2017-09-28 NOTE — Progress Notes (Signed)
Patient advised as below. Patient verbalizes understanding and is in agreement with treatment plan.  

## 2017-12-07 ENCOUNTER — Other Ambulatory Visit: Payer: Self-pay | Admitting: Family Medicine

## 2017-12-07 DIAGNOSIS — E119 Type 2 diabetes mellitus without complications: Secondary | ICD-10-CM

## 2018-01-08 ENCOUNTER — Other Ambulatory Visit: Payer: Self-pay | Admitting: Family Medicine

## 2018-01-08 DIAGNOSIS — E790 Hyperuricemia without signs of inflammatory arthritis and tophaceous disease: Secondary | ICD-10-CM

## 2018-02-28 ENCOUNTER — Ambulatory Visit: Payer: BLUE CROSS/BLUE SHIELD | Admitting: Family Medicine

## 2018-02-28 ENCOUNTER — Encounter: Payer: Self-pay | Admitting: Family Medicine

## 2018-02-28 VITALS — BP 126/78 | HR 64 | Temp 97.8°F | Resp 16 | Wt 216.0 lb

## 2018-02-28 DIAGNOSIS — E119 Type 2 diabetes mellitus without complications: Secondary | ICD-10-CM | POA: Diagnosis not present

## 2018-02-28 DIAGNOSIS — E785 Hyperlipidemia, unspecified: Secondary | ICD-10-CM

## 2018-02-28 DIAGNOSIS — I10 Essential (primary) hypertension: Secondary | ICD-10-CM | POA: Diagnosis not present

## 2018-02-28 LAB — POCT UA - MICROALBUMIN: Microalbumin Ur, POC: 20 mg/L

## 2018-02-28 LAB — POCT GLYCOSYLATED HEMOGLOBIN (HGB A1C): Hemoglobin A1C: 8.4 % — AB (ref 4.0–5.6)

## 2018-02-28 MED ORDER — METFORMIN HCL ER 500 MG PO TB24: 500.0000 mg | ORAL_TABLET | Freq: Every day | ORAL | 4 refills | Status: DC

## 2018-02-28 NOTE — Progress Notes (Signed)
Patient: Benjamin [name] Male    DOB: 1960/09/08   57 y.o.   MRN: [id number] Visit Date: 02/28/2018  Today's Provider: Mila Merryonald Fisher, MD   Chief Complaint  Patient presents with  . Diabetes  . Hyperlipidemia  . Hypertension   Subjective:    HPI     Diabetes Mellitus Type II, Follow-up:   Lab Results  Component Value Date   HGBA1C 6.6 (H) 09/26/2017   HGBA1C 6.5 (H) 03/29/2017   HGBA1C 6.3 12/27/2016   Last seen for diabetes 6 months ago.  Management since then includes No Changes. He reports excellent compliance with treatment. He is having side effects.  Current symptoms include none and have been stable. Home blood sugar records: fasting range: 120's  Episodes of hypoglycemia? no   Current Insulin Regimen: None Most Recent Eye Exam: 08/31/2017 Weight trend: stable Current diet: in general, a "healthy" diet   Current exercise: none  ------------------------------------------------------------------------   Hypertension, follow-up:  BP Readings from Last 3 Encounters:  09/26/17 130/80  03/29/17 120/80  12/27/16 132/82    He was last seen for hypertension 6 months ago.  BP at that visit was 130/80. Management since that visit includes No changes He reports excellent compliance with treatment. He is not having side effects.  He is not exercising. He is adherent to low salt diet.   Outside blood pressures are 130's/80's. He is experiencing none.  Patient denies chest pain, fatigue, lower extremity edema and palpitations.   Cardiovascular risk factors include advanced age (older than 7255 for men, 5965 for women), diabetes mellitus, dyslipidemia, hypertension and male gender.  Use of agents associated with hypertension: none.   ------------------------------------------------------------------------    Lipid/Cholesterol, Follow-up:   Last seen for this 6 months ago.  Management since that visit includes Started Pravastatin 40mg .  Last Lipid  Panel:    Component Value Date/Time   CHOL 168 09/26/2017 0900   TRIG 153 (H) 09/26/2017 0900   HDL 45 09/26/2017 0900   CHOLHDL 3.7 09/26/2017 0900   LDLCALC 92 09/26/2017 0900    He reports excellent compliance with treatment. He is not having side effects.   Wt Readings from Last 3 Encounters:  09/26/17 216 lb 6.4 oz (98.2 kg)  03/29/17 211 lb (95.7 kg)  12/27/16 210 lb (95.3 kg)    ------------------------------------------------------------------------  Pt also complains of left foot soreness and swelling for about four months.  He states it comes and goes. Doesn't limit activities. No particularly painful, just a discomfort and feeling like there is something underneath the ball of his foot.     No Known Allergies   Current Outpatient Medications:  .  acebutolol (SECTRAL) 400 MG capsule, Take 1 capsule (400 mg total) by mouth daily., Disp: 90 capsule, Rfl: 3 .  allopurinol (ZYLOPRIM) 300 MG tablet, Take 1 tablet (300 mg total) by mouth daily., Disp: 90 tablet, Rfl: 5 .  losartan-hydrochlorothiazide (HYZAAR) 50-12.5 MG tablet, Take 1 tablet by mouth every evening., Disp: 90 tablet, Rfl: 3 .  metFORMIN (GLUCOPHAGE) 500 MG tablet, Take 1 tablet (500 mg total) by mouth daily., Disp: 90 tablet, Rfl: 3 .  polyethylene glycol powder (GLYCOLAX/MIRALAX) powder, Take 17 g by mouth daily., Disp: 3350 g, Rfl: 1 .  pravastatin (PRAVACHOL) 40 MG tablet, Take 1 tablet (40 mg total) by mouth daily., Disp: 30 tablet, Rfl: 5 .  ranitidine (ZANTAC) 150 MG tablet, Take 150 mg by mouth 2 (two) times daily., Disp: , Rfl:  Review of Systems  Constitutional: Negative.   Respiratory: Negative.   Cardiovascular: Negative for chest pain, palpitations and leg swelling.  Gastrointestinal: Negative.   Musculoskeletal: Positive for myalgias.  Neurological: Negative for dizziness, light-headedness and headaches.    Social History   Tobacco Use  . Smoking status: Never Smoker  . Smokeless  tobacco: Never Used  Substance Use Topics  . Alcohol use: No   Objective:   BP 126/78 (BP Location: Right Arm, Patient Position: Sitting, Cuff Size: Normal)   Pulse 64   Temp 97.8 F (36.6 C) (Oral)   Resp 16   Wt 216 lb (98 kg)   BMI 30.99 kg/m     Physical Exam   General Appearance:    Alert, cooperative, no distress  Eyes:    PERRL, conjunctiva/corneas clear, EOM's intact       Lungs:     Clear to auscultation bilaterally, respirations unlabored  Heart:    Regular rate and rhythm  Neurologic:   Awake, alert, oriented x 3. No apparent focal neurological           defect.       Results for orders placed or performed in visit on 02/28/18  POCT glycosylated hemoglobin (Hb A1C)  Result Value Ref Range   Hemoglobin A1C 8.4 (A) 4.0 - 5.6 %  POCT UA - Microalbumin  Result Value Ref Range   Microalbumin Ur, POC 20 mg/L       Assessment & Plan:     1. Essential (primary) hypertension Well controlled.  Continue current medications.    2. Diabetes mellitus without complication (HCC) A1c up today. His weight is also up and he admits to eating more deserts lately. Also discussed potential for statins to increase blood sugar. He is to get more strict on diet and will increase metformin to metformin ER 2 x 500mg  tablet daily.  - POCT glycosylated hemoglobin (Hb A1C)  3. Hyperlipidemia, unspecified hyperlipidemia type He is tolerating pravastatin well with no adverse effects.   - Lipid panel - Comprehensive metabolic panel       Mila Merry, MD  [facility]

## 2018-03-01 LAB — LIPID PANEL
CHOL/HDL RATIO: 2.9 ratio (ref 0.0–5.0)
CHOLESTEROL TOTAL: 121 mg/dL (ref 100–199)
HDL: 42 mg/dL (ref >39)
LDL CALC: 56 mg/dL (ref 0–99)
TRIGLYCERIDES: 117 mg/dL (ref 0–149)
VLDL Cholesterol Cal: 23 mg/dL (ref 5–40)

## 2018-03-01 LAB — COMPREHENSIVE METABOLIC PANEL
ALT: 64 IU/L — AB (ref 0–44)
AST: 59 IU/L — ABNORMAL HIGH (ref 0–40)
Albumin/Globulin Ratio: 1.5 (ref 1.2–2.2)
Albumin: 4.3 g/dL (ref 3.5–5.5)
Alkaline Phosphatase: 63 IU/L (ref 39–117)
BUN/Creatinine Ratio: 12 (ref 9–20)
BUN: 12 mg/dL (ref 6–24)
Bilirubin Total: 0.6 mg/dL (ref 0.0–1.2)
CO2: 21 mmol/L (ref 20–29)
CREATININE: 1.04 mg/dL (ref 0.76–1.27)
Calcium: 9.1 mg/dL (ref 8.7–10.2)
Chloride: 99 mmol/L (ref 96–106)
GFR calc non Af Amer: 79 mL/min/{1.73_m2} (ref >59)
GFR, EST AFRICAN AMERICAN: 91 mL/min/{1.73_m2} (ref >59)
Globulin, Total: 2.9 g/dL (ref 1.5–4.5)
Glucose: 170 mg/dL — ABNORMAL HIGH (ref 65–99)
Potassium: 3.7 mmol/L (ref 3.5–5.2)
SODIUM: 137 mmol/L (ref 134–144)
Total Protein: 7.2 g/dL (ref 6.0–8.5)

## 2018-03-07 ENCOUNTER — Other Ambulatory Visit: Payer: Self-pay | Admitting: Family Medicine

## 2018-03-07 ENCOUNTER — Telehealth: Payer: Self-pay

## 2018-03-07 DIAGNOSIS — I10 Essential (primary) hypertension: Secondary | ICD-10-CM

## 2018-03-07 NOTE — Telephone Encounter (Signed)
Patient is calling office to inquire about lab results.

## 2018-03-08 ENCOUNTER — Telehealth: Payer: Self-pay

## 2018-03-08 NOTE — Telephone Encounter (Signed)
Please see result note 

## 2018-03-08 NOTE — Telephone Encounter (Signed)
Pt advised.   Thanks,   -Laura  

## 2018-06-08 ENCOUNTER — Encounter: Payer: Self-pay | Admitting: Family Medicine

## 2018-06-08 ENCOUNTER — Other Ambulatory Visit: Payer: Self-pay

## 2018-06-08 ENCOUNTER — Ambulatory Visit: Payer: BLUE CROSS/BLUE SHIELD | Admitting: Family Medicine

## 2018-06-08 VITALS — BP 110/62 | HR 44 | Temp 98.1°F | Resp 16 | Ht 70.0 in | Wt 197.0 lb

## 2018-06-08 DIAGNOSIS — R7401 Elevation of levels of liver transaminase levels: Secondary | ICD-10-CM

## 2018-06-08 DIAGNOSIS — R740 Nonspecific elevation of levels of transaminase and lactic acid dehydrogenase [LDH]: Secondary | ICD-10-CM | POA: Diagnosis not present

## 2018-06-08 DIAGNOSIS — E119 Type 2 diabetes mellitus without complications: Secondary | ICD-10-CM

## 2018-06-08 DIAGNOSIS — Z125 Encounter for screening for malignant neoplasm of prostate: Secondary | ICD-10-CM | POA: Diagnosis not present

## 2018-06-08 LAB — POCT GLYCOSYLATED HEMOGLOBIN (HGB A1C): Hemoglobin A1C: 6.4 % — AB (ref 4.0–5.6)

## 2018-06-08 NOTE — Progress Notes (Signed)
Patient: Benjamin [name] Male    DOB: 1960/10/09   58 y.o.   MRN: [id number] Visit Date: 06/08/2018  Today's Provider: Mila Merry, MD   Chief Complaint  Patient presents with  . Diabetes   Subjective:     HPI  Diabetes Mellitus Type II, Follow-up:   Lab Results  Component Value Date   HGBA1C 6.4 (A) 06/08/2018   HGBA1C 8.4 (A) 02/28/2018   HGBA1C 6.6 (H) 09/26/2017    Last seen for diabetes 3 months ago.  Management since then includes increasing Metformin to 2 500mg  tablets daily and changing to ER formulation He reports fair compliance with treatment.  He is still only taking one tablet a day, but has been walking every day and gotten much stricter with diet.  He is not having side effects.  Current symptoms include none and have been stable. Home blood sugar records: fasting range: 130s  Episodes of hypoglycemia? no  Patient also mentions that he has been walking 3 miles daily for the last 3 months. He is also choosing healthier foods to eat.    Current Insulin Regimen: none Most Recent Eye Exam: up to date Weight trend: stable Prior visit with dietician: No  Current diet habits: well balanced  Pertinent Labs:    Component Value Date/Time   CHOL 121 02/28/2018 0853   TRIG 117 02/28/2018 0853   HDL 42 02/28/2018 0853   LDLCALC 56 02/28/2018 0853   CREATININE 1.04 02/28/2018 0853    Wt Readings from Last 3 Encounters:  06/08/18 197 lb (89.4 kg)  02/28/18 216 lb (98 kg)  09/26/17 216 lb 6.4 oz (98.2 kg)    He was also noted to have mildly elevated transaminases with last labs.   Lab Results  Component Value Date   ALT 64 (H) 02/28/2018   AST 59 (H) 02/28/2018   ALKPHOS 63 02/28/2018   BILITOT 0.6 02/28/2018    No Known Allergies   Current Outpatient Medications:  .  acebutolol (SECTRAL) 400 MG capsule, Take 1 capsule (400 mg total) by mouth daily., Disp: 90 capsule, Rfl: 4 .  allopurinol (ZYLOPRIM) 300 MG tablet, Take 1 tablet (300  mg total) by mouth daily., Disp: 90 tablet, Rfl: 5 .  losartan-hydrochlorothiazide (HYZAAR) 50-12.5 MG tablet, Take 1 tablet by mouth every evening., Disp: 90 tablet, Rfl: 4 .  metFORMIN (GLUCOPHAGE-XR) 500 MG 24 hr tablet, Take 1 tablet (500 mg total) by mouth daily with breakfast., Disp: 180 tablet, Rfl: 4 .  omeprazole (PRILOSEC) 20 MG capsule, Take 20 mg by mouth daily., Disp: , Rfl:  .  polyethylene glycol powder (GLYCOLAX/MIRALAX) powder, Take 17 g by mouth daily., Disp: 3350 g, Rfl: 1 .  pravastatin (PRAVACHOL) 40 MG tablet, Take 1 tablet (40 mg total) by mouth daily., Disp: 90 tablet, Rfl: 4 .  ranitidine (ZANTAC) 150 MG tablet, Take 150 mg by mouth 2 (two) times daily., Disp: , Rfl:   Review of Systems  Constitutional: Negative for activity change, appetite change, chills, diaphoresis, fatigue, fever and unexpected weight change.  Respiratory: Negative for cough and shortness of breath.   Cardiovascular: Negative for chest pain, palpitations and leg swelling.  Endocrine: Negative for cold intolerance, heat intolerance, polydipsia, polyphagia and polyuria.  Neurological: Negative for dizziness, light-headedness and headaches.    Social History   Tobacco Use  . Smoking status: Never Smoker  . Smokeless tobacco: Never Used  Substance Use Topics  . Alcohol use: No  Objective:   BP 110/62 (BP Location: Right Arm, Patient Position: Sitting, Cuff Size: Normal)   Pulse (!) 44   Temp 98.1 F (36.7 C)   Resp 16   Ht 5\' 10"  (1.778 m)   Wt 197 lb (89.4 kg)   SpO2 98%   BMI 28.27 kg/m  Vitals:   06/08/18 0817  BP: 110/62  Pulse: (!) 44  Resp: 16  Temp: 98.1 F (36.7 C)  SpO2: 98%  Weight: 197 lb (89.4 kg)  Height: 5\' 10"  (1.778 m)     Physical Exam   General Appearance:    Alert, cooperative, no distress  Eyes:    PERRL, conjunctiva/corneas clear, EOM's intact       Lungs:     Clear to auscultation bilaterally, respirations unlabored  Heart:    Regular rate and  rhythm  Neurologic:   Awake, alert, oriented x 3. No apparent focal neurological           defect.       Results for orders placed or performed in visit on 06/08/18  POCT glycosylated hemoglobin (Hb A1C)  Result Value Ref Range   Hemoglobin A1C 6.4 (A) 4.0 - 5.6 %   HbA1c POC (<> result, manual entry)     HbA1c, POC (prediabetic range)     HbA1c, POC (controlled diabetic range)         Assessment & Plan    1. Diabetes mellitus without complication (HCC) Doing much better with diet and exercise improvements without having increased metformin dose. Continue current medications.  Follow up DM in about 5 months.  - POCT glycosylated hemoglobin (Hb A1C)  2. Elevated transaminase level Recheck - Hepatic function panel  3. Prostate cancer screening  - PSA     Mila Merry, MD  [facility]

## 2018-06-08 NOTE — Patient Instructions (Signed)
.   Please review the attached list of medications and notify my office if there are any errors.   . Please bring all of your medications to every appointment so we can make sure that our medication list is the same as yours.   

## 2018-06-09 LAB — HEPATIC FUNCTION PANEL
ALBUMIN: 4.4 g/dL (ref 3.8–4.9)
ALT: 29 IU/L (ref 0–44)
AST: 30 IU/L (ref 0–40)
Alkaline Phosphatase: 60 IU/L (ref 39–117)
Bilirubin Total: 0.5 mg/dL (ref 0.0–1.2)
Bilirubin, Direct: 0.13 mg/dL (ref 0.00–0.40)
Total Protein: 7.2 g/dL (ref 6.0–8.5)

## 2018-06-09 LAB — PSA: Prostate Specific Ag, Serum: 0.6 ng/mL (ref 0.0–4.0)

## 2018-06-11 ENCOUNTER — Telehealth: Payer: Self-pay

## 2018-06-11 NOTE — Telephone Encounter (Signed)
-----   Message from Malva Limes, MD sent at 06/09/2018  9:24 AM EDT ----- Labs good. Liver functions back down to normal. PSA is normal.

## 2018-06-11 NOTE — Telephone Encounter (Signed)
PT.ADVISED. KW

## 2018-06-27 ENCOUNTER — Encounter: Payer: BLUE CROSS/BLUE SHIELD | Admitting: Family Medicine

## 2018-07-31 ENCOUNTER — Encounter: Payer: BLUE CROSS/BLUE SHIELD | Admitting: Family Medicine

## 2018-08-22 ENCOUNTER — Encounter: Payer: Self-pay | Admitting: Family Medicine

## 2018-08-22 ENCOUNTER — Ambulatory Visit (INDEPENDENT_AMBULATORY_CARE_PROVIDER_SITE_OTHER): Payer: BC Managed Care – PPO | Admitting: Family Medicine

## 2018-08-22 ENCOUNTER — Other Ambulatory Visit: Payer: Self-pay

## 2018-08-22 VITALS — BP 116/73 | HR 53 | Temp 98.1°F | Ht 70.0 in | Wt 197.0 lb

## 2018-08-22 DIAGNOSIS — Z0000 Encounter for general adult medical examination without abnormal findings: Secondary | ICD-10-CM

## 2018-08-22 DIAGNOSIS — Z23 Encounter for immunization: Secondary | ICD-10-CM | POA: Diagnosis not present

## 2018-08-22 NOTE — Patient Instructions (Signed)
.   Please review the attached list of medications and notify my office if there are any errors.   . Please bring all of your medications to every appointment so we can make sure that our medication list is the same as yours.   

## 2018-08-22 NOTE — Progress Notes (Signed)
Patient: Benjamin [name], Male    DOB: 1960-11-03, 58 y.o.   MRN: [id number] Visit Date: 08/22/2018  Today's Provider: Mila Merry, MD   Chief Complaint  Patient presents with  . Annual Exam   Subjective:     Annual physical exam Benjamin [name] is a 58 y.o. male who presents today for health maintenance and complete physical. He feels well. He reports exercising everyday.  He states he walks about an hour everyday. He reports he is sleeping well. He had annual labs done prior to his visit today  Results for orders placed or performed in visit on 06/08/18  Hepatic function panel  Result Value Ref Range   Total Protein 7.2 6.0 - 8.5 g/dL   Albumin 4.4 3.8 - 4.9 g/dL   Bilirubin Total 0.5 0.0 - 1.2 mg/dL   Bilirubin, Direct 4.09 0.00 - 0.40 mg/dL   Alkaline Phosphatase 60 39 - 117 IU/L   AST 30 0 - 40 IU/L   ALT 29 0 - 44 IU/L  PSA  Result Value Ref Range   Prostate Specific Ag, Serum 0.6 0.0 - 4.0 ng/mL  POCT glycosylated hemoglobin (Hb A1C)  Result Value Ref Range   Hemoglobin A1C 6.4 (A) 4.0 - 5.6 %   HbA1c POC (<> result, manual entry)     HbA1c, POC (prediabetic range)     HbA1c, POC (controlled diabetic range)       -----------------------------------------------------------------   Review of Systems  Constitutional: Negative.   HENT: Negative.   Eyes: Negative.   Respiratory: Negative.   Cardiovascular: Negative.   Gastrointestinal: Negative.   Endocrine: Negative.   Genitourinary: Negative.   Musculoskeletal: Negative.   Skin: Negative.   Allergic/Immunologic: Negative.   Neurological: Negative.   Hematological: Negative.   Psychiatric/Behavioral: Negative.     Social History      He  reports that he has never smoked. He has never used smokeless tobacco. He reports that he does not drink alcohol or use drugs.       Social History   Socioeconomic History  . Marital status: Married    Spouse name: Not on file  . Number of children: 3   . Years of education: Not on file  . Highest education level: Not on file  Occupational History    Employer: [employer]  Social Needs  . Financial resource strain: Not on file  . Food insecurity:    Worry: Not on file    Inability: Not on file  . Transportation needs:    Medical: Not on file    Non-medical: Not on file  Tobacco Use  . Smoking status: Never Smoker  . Smokeless tobacco: Never Used  Substance and Sexual Activity  . Alcohol use: No  . Drug use: No  . Sexual activity: Not on file  Lifestyle  . Physical activity:    Days per week: Not on file    Minutes per session: Not on file  . Stress: Not on file  Relationships  . Social connections:    Talks on phone: Not on file    Gets together: Not on file    Attends religious service: Not on file    Active member of club or organization: Not on file    Attends meetings of clubs or organizations: Not on file    Relationship status: Not on file  Other Topics Concern  . Not on file  Social History Narrative   Is  youngest of [age].    Has two sons and one daughter    Past Medical History:  Diagnosis Date  . Diabetes mellitus without complication (HCC)   . Gastrocnemius muscle tear 05/12/2015  . Hypertension      Patient Active Problem List   Diagnosis Date Noted  . Ganglion cyst of wrist, right 12/27/2016  . GERD (gastroesophageal reflux disease) 09/26/2016  . Diabetes mellitus without complication (HCC) 03/23/2016  . Hyperuricemia 03/23/2016  . CN (constipation) 03/13/2015  . History of migraine headaches 03/13/2015  . Allergic rhinitis 01/02/2015  . Essential (primary) hypertension 01/02/2015  . Personal history of gout 01/02/2015    Past Surgical History:  Procedure Laterality Date  . COLONOSCOPY WITH PROPOFOL N/A 12/08/2016   Procedure: COLONOSCOPY WITH PROPOFOL;  Surgeon: Wyline MoodAnna, Kiran, MD;  Location: [facility] END[facility];  Service: Gastroenterology;  Laterality: N/A;    Family History         Family Status  Relation Name Status  . Mother  Deceased       stroke  . Father  Deceased at age 59       cancer  . Sister  Alive  . Brother  Alive  . Son  Alive  . Son  Alive  . Daughter  Alive        His family history includes Cancer in his sister; Colon cancer in his father; Diabetes in his brother and mother; Heart disease in his brother and mother.      No Known Allergies   Current Outpatient Medications:  .  acebutolol (SECTRAL) 400 MG capsule, Take 1 capsule (400 mg total) by mouth daily., Disp: 90 capsule, Rfl: 4 .  allopurinol (ZYLOPRIM) 300 MG tablet, Take 1 tablet (300 mg total) by mouth daily., Disp: 90 tablet, Rfl: 5 .  losartan-hydrochlorothiazide (HYZAAR) 50-12.5 MG tablet, Take 1 tablet by mouth every evening., Disp: 90 tablet, Rfl: 4 .  metFORMIN (GLUCOPHAGE-XR) 500 MG 24 hr tablet, Take 1 tablet (500 mg total) by mouth daily with breakfast., Disp: 180 tablet, Rfl: 4 .  omeprazole (PRILOSEC) 20 MG capsule, Take 20 mg by mouth daily., Disp: , Rfl:  .  polyethylene glycol powder (GLYCOLAX/MIRALAX) powder, Take 17 g by mouth daily., Disp: 3350 g, Rfl: 1 .  pravastatin (PRAVACHOL) 40 MG tablet, Take 1 tablet (40 mg total) by mouth daily., Disp: 90 tablet, Rfl: 4 .  ranitidine (ZANTAC) 150 MG tablet, Take 150 mg by mouth 2 (two) times daily., Disp: , Rfl:    Patient Care Team: Malva LimesFisher, Martin Belling E, MD as PCP - General (Family Medicine)    Objective:    Vitals: BP 116/73 (BP Location: Right Arm, Patient Position: Sitting, Cuff Size: Normal)   Pulse (!) 53   Temp 98.1 F (36.7 C) (Oral)   Ht 5\' 10"  (1.778 m)   Wt 197 lb (89.4 kg)   BMI 28.27 kg/m    Vitals:   08/22/18 1004  BP: 116/73  Pulse: (!) 53  Temp: 98.1 F (36.7 C)  TempSrc: Oral  Weight: 197 lb (89.4 kg)  Height: 5\' 10"  (1.778 m)     Physical Exam   General Appearance:    Alert, cooperative, no distress, appears stated age  Head:    Normocephalic, without obvious abnormality, atraumatic   Eyes:    PERRL, conjunctiva/corneas clear, EOM's intact, fundi    benign, both eyes       Ears:    Normal TM's and external ear canals, both ears  Nose:  Nares normal, septum midline, mucosa normal, no drainage   or sinus tenderness  Throat:   Lips, mucosa, and tongue normal; teeth and gums normal  Neck:   Supple, symmetrical, trachea midline, no adenopathy;       thyroid:  No enlargement/tenderness/nodules; no carotid   bruit or JVD  Back:     Symmetric, no curvature, ROM normal, no CVA tenderness  Lungs:     Clear to auscultation bilaterally, respirations unlabored  Chest wall:    No tenderness or deformity  Heart:    Regular rate and rhythm, S1 and S2 normal, no murmur, rub   or gallop  Abdomen:     Soft, non-tender, bowel sounds active all four quadrants,    no masses, no organomegaly  Genitalia:    deferred  Rectal:    deferred  Extremities:   Extremities normal, atraumatic, no cyanosis or edema  Pulses:   2+ and symmetric all extremities  Skin:   Skin color, texture, turgor normal, no rashes or lesions  Lymph nodes:   Cervical, supraclavicular, and axillary nodes normal  Neurologic:   CNII-XII intact. Normal strength, sensation and reflexes      throughout   Depression Screen PHQ 2/9 Scores 08/22/2018 06/08/2018 03/29/2017 09/26/2016  PHQ - 2 Score 0 0 0 0  PHQ- 9 Score 0 - 0 0       Assessment & Plan:     Routine Health Maintenance and Physical Exam  Exercise Activities and Dietary recommendations Goals    . Exercise 150 minutes per week (moderate activity)       Immunization History  Administered Date(s) Administered  . Influenza,inj,Quad PF,6+ Mos 12/27/2016  . Td 05/20/2013  . Tdap 05/20/2013    Health Maintenance  Topic Date Due  . HIV Screening  08/04/1974  . INFLUENZA VACCINE  10/20/2018  . TETANUS/TDAP  05/21/2023  . COLONOSCOPY  12/09/2026  . Hepatitis C Screening  Completed     Discussed health benefits of physical activity, and encouraged him  to engage in regular exercise appropriate for his age and condition.    --------------------------------------------------------------------  1. Annual physical exam Doing well, normal exam.   2. Need for shingles vaccine  - Varicella-zoster vaccine IM      Mila Merry, MD  [facility]

## 2018-08-28 LAB — HM DIABETES EYE EXAM

## 2018-09-03 ENCOUNTER — Encounter: Payer: Self-pay | Admitting: Family Medicine

## 2018-09-19 ENCOUNTER — Telehealth: Payer: Self-pay

## 2018-09-20 NOTE — Telephone Encounter (Signed)
Opened in error

## 2018-10-03 DIAGNOSIS — Z03818 Encounter for observation for suspected exposure to other biological agents ruled out: Secondary | ICD-10-CM | POA: Diagnosis not present

## 2018-10-09 DIAGNOSIS — B342 Coronavirus infection, unspecified: Secondary | ICD-10-CM | POA: Diagnosis not present

## 2018-11-09 ENCOUNTER — Ambulatory Visit: Payer: Self-pay | Admitting: Family Medicine

## 2018-11-09 ENCOUNTER — Other Ambulatory Visit: Payer: Self-pay

## 2018-11-09 ENCOUNTER — Ambulatory Visit: Payer: BC Managed Care – PPO | Admitting: Family Medicine

## 2018-11-09 ENCOUNTER — Encounter: Payer: Self-pay | Admitting: Family Medicine

## 2018-11-09 VITALS — BP 132/74 | HR 48 | Temp 96.6°F | Resp 16 | Wt 207.0 lb

## 2018-11-09 DIAGNOSIS — E119 Type 2 diabetes mellitus without complications: Secondary | ICD-10-CM | POA: Diagnosis not present

## 2018-11-09 DIAGNOSIS — K6289 Other specified diseases of anus and rectum: Secondary | ICD-10-CM

## 2018-11-09 DIAGNOSIS — Z23 Encounter for immunization: Secondary | ICD-10-CM | POA: Diagnosis not present

## 2018-11-09 LAB — POCT GLYCOSYLATED HEMOGLOBIN (HGB A1C)
Est. average glucose Bld gHb Est-mCnc: 148
Hemoglobin A1C: 6.8 % — AB (ref 4.0–5.6)

## 2018-11-09 MED ORDER — HYDROCORTISONE ACE-PRAMOXINE 1-1 % EX CREA: 1.0000 "application " | TOPICAL_CREAM | Freq: Two times a day (BID) | CUTANEOUS | 1 refills | Status: AC

## 2018-11-09 NOTE — Patient Instructions (Signed)
.   Please review the attached list of medications and notify my office if there are any errors.   . Please bring all of your medications to every appointment so we can make sure that our medication list is the same as yours.   . We will have flu vaccines available after Labor Day. Please go to your pharmacy or call the office in early September to schedule you flu shot.   

## 2018-11-09 NOTE — Progress Notes (Signed)
Patient: Benjamin BattlesFranklin A Altic Male    DOB: March 17, 1961   58 y.o.   MRN: [id number] Visit Date: 11/09/2018  Today's Provider: Mila Merryonald Magnus Crescenzo, MD   Chief Complaint  Patient presents with   Diabetes   Hypertension   Subjective:     HPI  Diabetes Mellitus Type II, Follow-up:   Lab Results  Component Value Date   HGBA1C 6.4 (A) 06/08/2018   HGBA1C 8.4 (A) 02/28/2018   HGBA1C 6.6 (H) 09/26/2017    Last seen for diabetes 5 months ago.  Management since then includes no changes. He reports good compliance with treatment. He is not having side effects.  Current symptoms include none and have been stable. Home blood sugar records: fasting range: 125-130  Episodes of hypoglycemia? no   Current insulin regiment: Is not on insulin Most Recent Eye Exam: UTD Weight trend: increasing steadily Prior visit with dietician: No Current exercise: walking Current diet habits: well balanced  Pertinent Labs:    Component Value Date/Time   CHOL 121 02/28/2018 0853   TRIG 117 02/28/2018 0853   HDL 42 02/28/2018 0853   LDLCALC 56 02/28/2018 0853   CREATININE 1.04 02/28/2018 0853    Wt Readings from Last 3 Encounters:  11/09/18 207 lb (93.9 kg)  08/22/18 197 lb (89.4 kg)  06/08/18 197 lb (89.4 kg)    ------------------------------------------------------------------------  Hypertension, follow-up:  BP Readings from Last 3 Encounters:  11/09/18 132/74  08/22/18 116/73  06/08/18 110/62    He was last seen for hypertension 8 months ago.  BP at that visit was 126/78. Management since that visit includes no changes. He reports good compliance with treatment. He is not having side effects.  He is exercising. He is adherent to low salt diet.   Outside blood pressures are not being checked. He is experiencing none.  Patient denies chest pain, chest pressure/discomfort, claudication, dyspnea, exertional chest pressure/discomfort, fatigue, irregular heart beat, lower  extremity edema, near-syncope, orthopnea, palpitations, paroxysmal nocturnal dyspnea, syncope and tachypnea.   Cardiovascular risk factors include advanced age (older than 7855 for men, 5965 for women), diabetes mellitus, hypertension and male gender.  Use of agents associated with hypertension: none.     Weight trend: increasing steadily Wt Readings from Last 3 Encounters:  11/09/18 207 lb (93.9 kg)  08/22/18 197 lb (89.4 kg)  06/08/18 197 lb (89.4 kg)    Current diet: well balanced  ------------------------------------------------------------------------  He also reports persistent anal itching for the last month after having a few days of constipation. No bleeding or masses, but burns sometimes.   No Known Allergies   Current Outpatient Medications:    acebutolol (SECTRAL) 400 MG capsule, Take 1 capsule (400 mg total) by mouth daily., Disp: 90 capsule, Rfl: 4   allopurinol (ZYLOPRIM) 300 MG tablet, Take 1 tablet (300 mg total) by mouth daily., Disp: 90 tablet, Rfl: 5   losartan-hydrochlorothiazide (HYZAAR) 50-12.5 MG tablet, Take 1 tablet by mouth every evening., Disp: 90 tablet, Rfl: 4   metFORMIN (GLUCOPHAGE-XR) 500 MG 24 hr tablet, Take 1 tablet (500 mg total) by mouth daily with breakfast., Disp: 180 tablet, Rfl: 4   omeprazole (PRILOSEC) 20 MG capsule, Take 20 mg by mouth daily., Disp: , Rfl:    polyethylene glycol powder (GLYCOLAX/MIRALAX) powder, Take 17 g by mouth daily., Disp: 3350 g, Rfl: 1   pravastatin (PRAVACHOL) 40 MG tablet, Take 1 tablet (40 mg total) by mouth daily., Disp: 90 tablet, Rfl: 4  Review of Systems  Constitutional: Negative for appetite change, chills and fever.  Respiratory: Negative for chest tightness, shortness of breath and wheezing.   Cardiovascular: Negative for chest pain and palpitations.  Gastrointestinal: Negative for abdominal pain, nausea and vomiting.       Anal itching    Social History   Tobacco Use   Smoking status: Never  Smoker   Smokeless tobacco: Never Used  Substance Use Topics   Alcohol use: No      Objective:   BP 132/74 (BP Location: Left Arm, Patient Position: Sitting)    Pulse (!) 48    Temp (!) 96.6 F (35.9 C) (Temporal)    Resp 16    Wt 207 lb (93.9 kg)    SpO2 98% Comment: room air   BMI 29.70 kg/m  Vitals:   11/09/18 0842  BP: 132/74  Pulse: (!) 48  Resp: 16  Temp: (!) 96.6 F (35.9 C)  TempSrc: Temporal  SpO2: 98%  Weight: 207 lb (93.9 kg)     Physical Exam   General Appearance:    Alert, cooperative, no distress  Eyes:    PERRL, conjunctiva/corneas clear, EOM's intact       Lungs:     Clear to auscultation bilaterally, respirations unlabored  Heart:    Bradycardic. Normal rhythm. No murmurs, rubs, or gallops.       Results for orders placed or performed in visit on 11/09/18  POCT HgB A1C  Result Value Ref Range   Hemoglobin A1C 6.8 (A) 4.0 - 5.6 %   HbA1c POC (<> result, manual entry)     HbA1c, POC (prediabetic range)     HbA1c, POC (controlled diabetic range)     Est. average glucose Bld gHb Est-mCnc 148        Assessment & Plan    1. Diabetes mellitus without complication ([place]) Well controlled Continue current medications.    2. Proctitis Prescription - pramoxine-hydrocortisone (PROCTOCREAM-HC) 1-1 % rectal cream; Place 1 application rectally 2 (two) times daily.  Dispense: 30 g; Refill: 1  Out of Shingrix. He will return for Shingrix #2 and flu vaccine.   The entirety of the information documented in the History of Present Illness, Review of Systems and Physical Exam were personally obtained by me. Portions of this information were initially documented by Meyer Cory, CMA and reviewed by me for thoroughness and accuracy.  Lelon Huh, MD  [facility]

## 2018-11-19 ENCOUNTER — Ambulatory Visit (INDEPENDENT_AMBULATORY_CARE_PROVIDER_SITE_OTHER): Payer: BC Managed Care – PPO | Admitting: Family Medicine

## 2018-11-19 ENCOUNTER — Other Ambulatory Visit: Payer: Self-pay

## 2018-11-19 DIAGNOSIS — Z23 Encounter for immunization: Secondary | ICD-10-CM

## 2018-11-19 NOTE — Progress Notes (Signed)
Nurse visit only: Update immunizations.

## 2018-11-22 NOTE — Patient Instructions (Signed)
.   Please review the attached list of medications and notify my office if there are any errors.   . Please bring all of your medications to every appointment so we can make sure that our medication list is the same as yours.   . It is especially important to get the annual flu vaccine this year. If you haven't had it already, please go to your pharmacy or call the office as soon as possible to schedule you flu shot.  

## 2019-02-18 ENCOUNTER — Other Ambulatory Visit: Payer: Self-pay | Admitting: Family Medicine

## 2019-02-18 DIAGNOSIS — E790 Hyperuricemia without signs of inflammatory arthritis and tophaceous disease: Secondary | ICD-10-CM

## 2019-03-05 ENCOUNTER — Other Ambulatory Visit: Payer: Self-pay | Admitting: Family Medicine

## 2019-03-05 DIAGNOSIS — E790 Hyperuricemia without signs of inflammatory arthritis and tophaceous disease: Secondary | ICD-10-CM

## 2019-03-11 ENCOUNTER — Other Ambulatory Visit: Payer: Self-pay | Admitting: Family Medicine

## 2019-03-11 DIAGNOSIS — I10 Essential (primary) hypertension: Secondary | ICD-10-CM

## 2019-04-01 ENCOUNTER — Other Ambulatory Visit: Payer: Self-pay | Admitting: Family Medicine

## 2019-04-01 DIAGNOSIS — I10 Essential (primary) hypertension: Secondary | ICD-10-CM

## 2019-04-01 NOTE — Telephone Encounter (Signed)
Requested Prescriptions  Pending Prescriptions Disp Refills  . acebutolol (SECTRAL) 400 MG capsule [Pharmacy Med Name: ACEBUTOLOL 400 MG CAPSULE] 90 capsule 0    Sig: Take 1 capsule (400 mg total) by mouth daily.     Cardiovascular:  Beta Blockers Passed - 04/01/2019  2:13 PM      Passed - Last BP in normal range    BP Readings from Last 1 Encounters:  11/09/18 132/74         Passed - Last Heart Rate in normal range    Pulse Readings from Last 1 Encounters:  11/09/18 (!) 48         Passed - Valid encounter within last 6 months    Recent Outpatient Visits          4 months ago Need for shingles vaccine   [facility] Malva Limes, MD   4 months ago Diabetes mellitus without complication [facility])   [facility] Malva Limes, MD   7 months ago Annual physical exam   [facility] Malva Limes, MD   9 months ago Diabetes mellitus without complication [facility])   [facility] Malva Limes, MD   1 year ago Essential (primary) hypertension   [facility] Fisher, Demetrios Isaacs, MD      Future Appointments            In 2 months Fisher, Demetrios Isaacs, MD [facility]           . pravastatin (PRAVACHOL) 40 MG tablet [Pharmacy Med Name: PRAVASTATIN SODIUM 40 MG TAB] 90 tablet 0    Sig: Take 1 tablet (40 mg total) by mouth daily.     Cardiovascular:  Antilipid - Statins Failed - 04/01/2019  2:13 PM      Failed - Total Cholesterol in normal range and within 360 days    Cholesterol, Total  Date Value Ref Range Status  02/28/2018 121 100 - 199 mg/dL Final         Failed - LDL in normal range and within 360 days    LDL Calculated  Date Value Ref Range Status  02/28/2018 56 0 - 99 mg/dL Final         Failed - HDL in normal range and within 360 days    HDL  Date Value Ref Range Status  02/28/2018 42 >39 mg/dL Final         Failed - Triglycerides in normal range and within 360 days   Triglycerides  Date Value Ref Range Status  02/28/2018 117 0 - 149 mg/dL Final         Passed - Patient is not pregnant      Passed - Valid encounter within last 12 months    Recent Outpatient Visits          4 months ago Need for shingles vaccine   [facility] Malva Limes, MD   4 months ago Diabetes mellitus without complication [facility])   [facility] Malva Limes, MD   7 months ago Annual physical exam   [facility] Malva Limes, MD   9 months ago Diabetes mellitus without complication [facility])   [facility] Malva Limes, MD   1 year ago Essential (primary) hypertension   [facility] Fisher, Demetrios Isaacs, MD      Future Appointments            In 2 months Fisher,  Kirstie Peri, MD [facility] [place]

## 2019-05-20 ENCOUNTER — Other Ambulatory Visit: Payer: Self-pay | Admitting: Family Medicine

## 2019-05-20 DIAGNOSIS — E119 Type 2 diabetes mellitus without complications: Secondary | ICD-10-CM

## 2019-06-07 NOTE — Progress Notes (Signed)
Patient: Benjamin [name]   DOB: Oct 04, 1960   59 y.o. Male  MRN: [id number] Visit Date: 06/10/2019  Today's Provider: Lelon Huh, MD  Subjective:    Chief Complaint  Patient presents with  . Diabetes   HPI  Diabetes Mellitus Type II, Follow-up:   Lab Results  Component Value Date   HGBA1C 6.8 (A) 11/09/2018   HGBA1C 6.4 (A) 06/08/2018   HGBA1C 8.4 (A) 02/28/2018    Last seen for diabetes 7 months ago.  Management since then includes no changes. He reports good compliance with treatment. He is not having side effects.  Current symptoms include none  Home blood sugar records: fasting range: not being checked at home  Episodes of hypoglycemia? no   Current insulin regiment: Is not on insulin Most Recent Eye Exam: 08/28/2018 Weight trend: stable Prior visit with dietician: No Current exercise: walking Current diet habits: in general, a "healthy" diet    Pertinent Labs:    Component Value Date/Time   CHOL 121 02/28/2018 0853   TRIG 117 02/28/2018 0853   HDL 42 02/28/2018 0853   LDLCALC 56 02/28/2018 0853   CREATININE 1.04 02/28/2018 0853    Wt Readings from Last 3 Encounters:  06/10/19 208 lb 12.8 oz (94.7 kg)  11/09/18 207 lb (93.9 kg)  08/22/18 197 lb (89.4 kg)    ------------------------------------------------------------------------ He also reports that he has poor libido and difficulty maintaining erections.   He also reports episodes of numbness and discomfort in the distal aspect of feet.      Medications: Outpatient Medications Prior to Visit  Medication Sig Dispense Refill  . acebutolol (SECTRAL) 400 MG capsule Take 1 capsule (400 mg total) by mouth daily. 90 capsule 0  . allopurinol (ZYLOPRIM) 300 MG tablet Take 1 tablet (300 mg total) by mouth daily. 90 tablet 0  . losartan-hydrochlorothiazide (HYZAAR) 50-12.5 MG tablet Take 1 tablet by mouth every evening. 90 tablet 0  . metFORMIN (GLUCOPHAGE-XR) 500 MG 24 hr tablet Take 1 tablet  (500 mg total) by mouth daily with breakfast. 90 tablet 1  . omeprazole (PRILOSEC) 20 MG capsule Take 20 mg by mouth daily.    . polyethylene glycol powder (GLYCOLAX/MIRALAX) powder Take 17 g by mouth daily. 3350 g 1  . pramoxine-hydrocortisone (PROCTOCREAM-HC) 1-1 % rectal cream Place 1 application rectally 2 (two) times daily. 30 g 1  . pravastatin (PRAVACHOL) 40 MG tablet Take 1 tablet (40 mg total) by mouth daily. 90 tablet 0   No facility-administered medications prior to visit.   Review of Systems  Constitutional: Negative for malaise/fatigue and weight loss.  Eyes: Negative for blurred vision.  Respiratory: Negative.  Negative for cough, shortness of breath and wheezing.   Cardiovascular: Negative for chest pain, palpitations, orthopnea, claudication, leg swelling and PND.  Neurological: Negative for weakness and headaches.         Objective:   BP (!) 155/83 (BP Location: Right Arm, Patient Position: Sitting, Cuff Size: Large)   Pulse (!) 49   Temp (!) 96.2 F (35.7 C) (Temporal)   Wt 208 lb 12.8 oz (94.7 kg)   BMI 29.96 kg/m  Vitals:   06/10/19 0814  BP: (!) 155/83  Pulse: (!) 49  Temp: (!) 96.2 F (35.7 C)  TempSrc: Temporal  Weight: 208 lb 12.8 oz (94.7 kg)     Physical Exam    General: Appearance:     Overweight male in no acute distress  Eyes:  PERRL, conjunctiva/corneas clear, EOM's intact       Lungs:     Clear to auscultation bilaterally, respirations unlabored  Heart:    Bradycardic. Normal rhythm. No murmurs, rubs, or gallops.   MS:   All extremities are intact.   Neurologic:   Awake, alert, oriented x 3. No apparent focal neurological           defect.        Results for orders placed or performed in visit on 06/10/19  POCT HgB A1C  Result Value Ref Range   Hemoglobin A1C 7.3 (A) 4.0 - 5.6 %   Est. average glucose Bld gHb Est-mCnc 163       Assessment & Plan:    1. Diabetes mellitus without complication (HCC) a1c up a bit. Counseled on  improving diet and increasing exercise.  - metFORMIN (GLUCOPHAGE-XR) 500 MG 24 hr tablet; Take 1 tablet (500 mg total) by mouth daily with breakfast.  Dispense: 90 tablet; Refill: 1  2. Hyperuricemia Reports he has had no gout flares in years while on current dose of allopurinol - Uric acid  3. Essential (primary) hypertension . Increase- losartan-hydrochlorothiazide (HYZAAR) 100-12.5 MG tablet; Take 1 tablet by mouth daily.  Dispense: 90 tablet; Refill: 3  4. Hyperlipidemia, unspecified hyperlipidemia type He is tolerating pravastatin well with no adverse effects.   - pravastatin (PRAVACHOL) 40 MG tablet; Take 1 tablet (40 mg total) by mouth daily.  Dispense: 90 tablet; Refill: 3 - Comprehensive metabolic panel - Lipid panel - CBC  5. Prostate cancer screening  - PSA  6. Numbness and tingling of both feet  - Vitamin B12  7. Loss of libido  - Testosterone,Free and Total - TSH    The entirety of the information documented in the History of Present Illness, Review of Systems and Physical Exam were personally obtained by me. Portions of this information were initially documented by Martyn Ehrich, CMA and reviewed by me for thoroughness and accuracy.   Mila Merry, MD  [facility] [phone] (phone) [phone] (fax)  [facility]

## 2019-06-10 ENCOUNTER — Other Ambulatory Visit: Payer: Self-pay

## 2019-06-10 ENCOUNTER — Ambulatory Visit (INDEPENDENT_AMBULATORY_CARE_PROVIDER_SITE_OTHER): Payer: BC Managed Care – PPO | Admitting: Family Medicine

## 2019-06-10 ENCOUNTER — Encounter: Payer: Self-pay | Admitting: Family Medicine

## 2019-06-10 VITALS — BP 132/82 | HR 49 | Temp 96.2°F | Wt 208.8 lb

## 2019-06-10 DIAGNOSIS — Z125 Encounter for screening for malignant neoplasm of prostate: Secondary | ICD-10-CM | POA: Diagnosis not present

## 2019-06-10 DIAGNOSIS — R202 Paresthesia of skin: Secondary | ICD-10-CM | POA: Diagnosis not present

## 2019-06-10 DIAGNOSIS — R6882 Decreased libido: Secondary | ICD-10-CM

## 2019-06-10 DIAGNOSIS — E785 Hyperlipidemia, unspecified: Secondary | ICD-10-CM | POA: Diagnosis not present

## 2019-06-10 DIAGNOSIS — E119 Type 2 diabetes mellitus without complications: Secondary | ICD-10-CM

## 2019-06-10 DIAGNOSIS — I10 Essential (primary) hypertension: Secondary | ICD-10-CM

## 2019-06-10 DIAGNOSIS — E790 Hyperuricemia without signs of inflammatory arthritis and tophaceous disease: Secondary | ICD-10-CM | POA: Diagnosis not present

## 2019-06-10 DIAGNOSIS — R200 Anesthesia of skin: Secondary | ICD-10-CM

## 2019-06-10 LAB — POCT GLYCOSYLATED HEMOGLOBIN (HGB A1C)
Est. average glucose Bld gHb Est-mCnc: 163
Hemoglobin A1C: 7.3 % — AB (ref 4.0–5.6)

## 2019-06-10 MED ORDER — LOSARTAN POTASSIUM-HCTZ 100-12.5 MG PO TABS: 1.0000 | ORAL_TABLET | Freq: Every day | ORAL | 3 refills | Status: DC

## 2019-06-10 MED ORDER — ACEBUTOLOL HCL 400 MG PO CAPS: 400.0000 mg | ORAL_CAPSULE | Freq: Every day | ORAL | 3 refills | Status: DC

## 2019-06-10 MED ORDER — METFORMIN HCL ER 500 MG PO TB24: 500.0000 mg | ORAL_TABLET | Freq: Every day | ORAL | 1 refills | Status: DC

## 2019-06-10 MED ORDER — PRAVASTATIN SODIUM 40 MG PO TABS: 40.0000 mg | ORAL_TABLET | Freq: Every day | ORAL | 3 refills | Status: DC

## 2019-06-10 NOTE — Patient Instructions (Signed)
.   Please review the attached list of medications and notify my office if there are any errors.   . Please bring all of your medications to every appointment so we can make sure that our medication list is the same as yours.    Recommend wearing Dr. Margart Sickles gel pad

## 2019-06-14 ENCOUNTER — Telehealth: Payer: Self-pay

## 2019-06-14 DIAGNOSIS — E291 Testicular hypofunction: Secondary | ICD-10-CM | POA: Insufficient documentation

## 2019-06-14 LAB — COMPREHENSIVE METABOLIC PANEL
ALT: 42 IU/L (ref 0–44)
AST: 38 IU/L (ref 0–40)
Albumin/Globulin Ratio: 1.6 (ref 1.2–2.2)
Albumin: 4.5 g/dL (ref 3.8–4.9)
Alkaline Phosphatase: 65 IU/L (ref 39–117)
BUN/Creatinine Ratio: 12 (ref 9–20)
BUN: 12 mg/dL (ref 6–24)
Bilirubin Total: 0.6 mg/dL (ref 0.0–1.2)
CO2: 19 mmol/L — ABNORMAL LOW (ref 20–29)
Calcium: 9.4 mg/dL (ref 8.7–10.2)
Chloride: 102 mmol/L (ref 96–106)
Creatinine, Ser: 1.01 mg/dL (ref 0.76–1.27)
GFR calc Af Amer: 94 mL/min/{1.73_m2} (ref >59)
GFR calc non Af Amer: 81 mL/min/{1.73_m2} (ref >59)
Globulin, Total: 2.9 g/dL (ref 1.5–4.5)
Glucose: 147 mg/dL — ABNORMAL HIGH (ref 65–99)
Potassium: 3.8 mmol/L (ref 3.5–5.2)
Sodium: 139 mmol/L (ref 134–144)
Total Protein: 7.4 g/dL (ref 6.0–8.5)

## 2019-06-14 LAB — LIPID PANEL
Chol/HDL Ratio: 2.7 ratio (ref 0.0–5.0)
Cholesterol, Total: 134 mg/dL (ref 100–199)
HDL: 49 mg/dL (ref >39)
LDL Chol Calc (NIH): 65 mg/dL (ref 0–99)
Triglycerides: 108 mg/dL (ref 0–149)
VLDL Cholesterol Cal: 20 mg/dL (ref 5–40)

## 2019-06-14 LAB — CBC
Hematocrit: 41.3 % (ref 37.5–51.0)
Hemoglobin: 14.1 g/dL (ref 13.0–17.7)
MCH: 29.5 pg (ref 26.6–33.0)
MCHC: 34.1 g/dL (ref 31.5–35.7)
MCV: 86 fL (ref 79–97)
Platelets: 171 10*3/uL (ref 150–450)
RBC: 4.78 x10E6/uL (ref 4.14–5.80)
RDW: 13 % (ref 11.6–15.4)
WBC: 7.4 10*3/uL (ref 3.4–10.8)

## 2019-06-14 LAB — TESTOSTERONE,FREE AND TOTAL
Testosterone, Free: 12.3 pg/mL (ref 7.2–24.0)
Testosterone: 260 ng/dL — ABNORMAL LOW (ref 264–916)

## 2019-06-14 LAB — PSA: Prostate Specific Ag, Serum: 0.6 ng/mL (ref 0.0–4.0)

## 2019-06-14 LAB — TSH: TSH: 3.78 u[IU]/mL (ref 0.450–4.500)

## 2019-06-14 LAB — VITAMIN B12: Vitamin B-12: 574 pg/mL (ref 232–1245)

## 2019-06-14 LAB — URIC ACID: Uric Acid: 4.4 mg/dL (ref 3.8–8.4)

## 2019-06-14 NOTE — Telephone Encounter (Signed)
Ok, please print pended order and leave at lab

## 2019-06-14 NOTE — Telephone Encounter (Signed)
-----   Message from Malva Limes, MD sent at 06/14/2019  7:30 AM EDT ----- Testosterone level is borderline low, rest of labs are normal. Testosterone medication may be helpful. Insurance requires that testosterone be checked twice to make sure it is consistently low before they will cover medications. Let me know if he wants to proceed and we'll place order to recheck it.

## 2019-06-14 NOTE — Telephone Encounter (Signed)
Patient advised. He would like to proceed with checking early morning testosterone levels.

## 2019-06-14 NOTE — Telephone Encounter (Signed)
Patient advised of results.

## 2019-06-14 NOTE — Telephone Encounter (Signed)
Copied from CRM [id number]. Topic: General - Inquiry >> Jun 14, 2019 10:18 AM Lynne Logan D wrote: Reason for CRM: Pt called to follow up on lab results from labs done on 06/10/19. Please return call.

## 2019-06-19 ENCOUNTER — Other Ambulatory Visit: Payer: Self-pay | Admitting: Family Medicine

## 2019-06-19 DIAGNOSIS — E790 Hyperuricemia without signs of inflammatory arthritis and tophaceous disease: Secondary | ICD-10-CM

## 2019-06-25 MED ORDER — TADALAFIL 10 MG PO TABS: 10.0000 mg | ORAL_TABLET | ORAL | 3 refills | Status: DC | PRN

## 2019-06-25 NOTE — Addendum Note (Signed)
Addended by: Malva Limes on: 06/25/2019 04:55 PM   Modules accepted: Orders

## 2019-06-25 NOTE — Telephone Encounter (Signed)
Please review and advise on Testosterone lab that was done on 06/17/2019. Patient is aware that you are out of the office this week.

## 2019-06-25 NOTE — Telephone Encounter (Signed)
The follow up test was normal. Testosterone supplements would not be covered by insurance and would not like make any difference. If he likes I can send prescription for Cialis to his pharmacy.

## 2019-06-25 NOTE — Telephone Encounter (Signed)
Pt has called in on 4/6 and wants results from 2nd lab on the 29th, states has not been contacted. Please FU with pt at [phone]

## 2019-06-25 NOTE — Telephone Encounter (Signed)
Patient advised and would like to take Cialis, he said it can be sent to [facility].

## 2019-06-27 LAB — TESTOSTERONE,FREE AND TOTAL: Testosterone: 310 ng/dL (ref 264–916)

## 2019-08-23 ENCOUNTER — Ambulatory Visit (INDEPENDENT_AMBULATORY_CARE_PROVIDER_SITE_OTHER): Payer: No Typology Code available for payment source | Admitting: Family Medicine

## 2019-08-23 ENCOUNTER — Encounter: Payer: Self-pay | Admitting: Family Medicine

## 2019-08-23 ENCOUNTER — Other Ambulatory Visit: Payer: Self-pay

## 2019-08-23 ENCOUNTER — Telehealth: Payer: Self-pay

## 2019-08-23 VITALS — BP 124/80 | HR 51 | Temp 96.6°F | Ht 70.0 in | Wt 210.0 lb

## 2019-08-23 DIAGNOSIS — Z136 Encounter for screening for cardiovascular disorders: Secondary | ICD-10-CM | POA: Diagnosis not present

## 2019-08-23 DIAGNOSIS — K219 Gastro-esophageal reflux disease without esophagitis: Secondary | ICD-10-CM

## 2019-08-23 DIAGNOSIS — I10 Essential (primary) hypertension: Secondary | ICD-10-CM

## 2019-08-23 DIAGNOSIS — Z0000 Encounter for general adult medical examination without abnormal findings: Secondary | ICD-10-CM | POA: Diagnosis not present

## 2019-08-23 NOTE — Progress Notes (Signed)
I,Laura E Walsh,acting as a scribe for Lelon Huh, MD.,have documented all relevant documentation on the behalf of Lelon Huh, MD,as directed by  Lelon Huh, MD while in the presence of Lelon Huh, MD.    Complete physical exam   Patient: Benjamin [name]   DOB: 18-Oct-1960   59 y.o. Male  MRN: [id number] Visit Date: 08/23/2019  Today's healthcare provider: Lelon Huh, MD   Chief Complaint  Patient presents with  . Annual Exam   Subjective    Benjamin [name] is a 59 y.o. male who presents today for a complete physical exam.  He reports consuming a general diet.He generally feels well. He reports sleeping well. He does not have additional problems to discuss today.  HPI   ---------------------------------------------------------------------------------------------------  Hypertension, follow-up  BP Readings from Last 3 Encounters:  08/23/19 124/80  06/10/19 132/82  11/09/18 132/74   Wt Readings from Last 3 Encounters:  08/23/19 210 lb (95.3 kg)  06/10/19 208 lb 12.8 oz (94.7 kg)  11/09/18 207 lb (93.9 kg)     He was last seen for hypertension 06/10/2019.  BP at that visit was 132/82. Management since that visit includes increasing  losartan-hydrochlorothiazide to 100-12.5 MG tablet.  He reports excellent compliance with treatment. He is not having side effects.  He is following a Low Sodium diet. He is exercising. He does not smoke.  Use of agents associated with hypertension: none.     Past Medical History:  Diagnosis Date  . Diabetes mellitus without complication ([place])   . Gastrocnemius muscle tear 05/12/2015  . Hypertension    Past Surgical History:  Procedure Laterality Date  . COLONOSCOPY WITH PROPOFOL N/A 12/08/2016   Procedure: COLONOSCOPY WITH PROPOFOL;  Surgeon: Jonathon Bellows, MD;  Location: [facility] ENDOSCOPY;  Service: Gastroenterology;  Laterality: N/A;   Social History   Socioeconomic History  . Marital status: Married    Spouse  name: Not on file  . Number of children: 3  . Years of education: Not on file  . Highest education level: Not on file  Occupational History    Employer: [employer]  Tobacco Use  . Smoking status: Never Smoker  . Smokeless tobacco: Never Used  Substance and Sexual Activity  . Alcohol use: No  . Drug use: No  . Sexual activity: Not on file  Other Topics Concern  . Not on file  Social History Narrative   Is youngest of 7.    Has two sons and one daughter   Social Determinants of Radio broadcast assistant Strain:   . Difficulty of Paying Living Expenses:   Food Insecurity:   . Worried About Charity fundraiser in the Last Year:   . Arboriculturist in the Last Year:   Transportation Needs:   . Film/video editor (Medical):   Marland Kitchen Lack of Transportation (Non-Medical):   Physical Activity:   . Days of Exercise per Week:   . Minutes of Exercise per Session:   Stress:   . Feeling of Stress :   Social Connections:   . Frequency of Communication with Friends and Family:   . Frequency of Social Gatherings with Friends and Family:   . Attends Religious Services:   . Active Member of Clubs or Organizations:   . Attends Archivist Meetings:   Marland Kitchen Marital Status:   Intimate Partner Violence:   . Fear of Current or Ex-Partner:   . Emotionally Abused:   Marland Kitchen Physically  Abused:   . Sexually Abused:    Family Status  Relation Name Status  . Mother  Deceased       stroke  . Father  Deceased at age 26       cancer  . Sister  Alive  . Brother  Alive  . Son  Alive  . Son  Alive  . Daughter  Alive   Family History  Problem Relation Age of Onset  . Diabetes Mother   . Heart disease Mother   . Colon cancer Father   . Cancer Sister        Bone Cancer  . Heart disease Brother   . Diabetes Brother    No Known Allergies  Patient Care Team: Malva Limes, MD as PCP - General (Family Medicine)   Medications: Outpatient Medications Prior to Visit    Medication Sig  . acebutolol (SECTRAL) 400 MG capsule Take 1 capsule (400 mg total) by mouth daily.  Marland Kitchen allopurinol (ZYLOPRIM) 300 MG tablet Take 1 tablet (300 mg total) by mouth daily.  Marland Kitchen losartan-hydrochlorothiazide (HYZAAR) 100-12.5 MG tablet Take 1 tablet by mouth daily.  . metFORMIN (GLUCOPHAGE-XR) 500 MG 24 hr tablet Take 1 tablet (500 mg total) by mouth daily with breakfast.  . omeprazole (PRILOSEC) 20 MG capsule Take 20 mg by mouth daily.  . polyethylene glycol powder (GLYCOLAX/MIRALAX) powder Take 17 g by mouth daily.  . pramoxine-hydrocortisone (PROCTOCREAM-HC) 1-1 % rectal cream Place 1 application rectally 2 (two) times daily.  . pravastatin (PRAVACHOL) 40 MG tablet Take 1 tablet (40 mg total) by mouth daily.  . tadalafil (CIALIS) 10 MG tablet Take 1 tablet (10 mg total) by mouth every other day as needed for erectile dysfunction.   No facility-administered medications prior to visit.    Review of Systems  Constitutional: Negative for appetite change, chills, fatigue and fever.  HENT: Negative for congestion, ear pain, hearing loss, nosebleeds and trouble swallowing.   Eyes: Negative for pain and visual disturbance.  Respiratory: Negative for cough, chest tightness and shortness of breath.   Cardiovascular: Negative for chest pain, palpitations and leg swelling.  Gastrointestinal: Negative for abdominal pain, blood in stool, constipation, diarrhea, nausea and vomiting.  Endocrine: Negative for polydipsia, polyphagia and polyuria.  Genitourinary: Negative for dysuria and flank pain.  Musculoskeletal: Negative for arthralgias, back pain, joint swelling, myalgias and neck stiffness.  Skin: Negative for color change, rash and wound.  Neurological: Negative for dizziness, tremors, seizures, speech difficulty, weakness, light-headedness and headaches.  Psychiatric/Behavioral: Negative for behavioral problems, confusion, decreased concentration, dysphoric mood and sleep disturbance.  The patient is not nervous/anxious.   All other systems reviewed and are negative.     Objective    BP 124/80 (BP Location: Right Arm, Patient Position: Sitting, Cuff Size: Large)   Pulse (!) 51   Temp (!) 96.6 F (35.9 C) (Temporal)   Ht 5\' 10"  (1.778 m)   Wt 210 lb (95.3 kg)   BMI 30.13 kg/m  BP Readings from Last 3 Encounters:  08/23/19 124/80  06/10/19 132/82  11/09/18 132/74      Physical Exam   General Appearance:    Obese male. Alert, cooperative, in no acute distress, appears stated age  Head:    Normocephalic, without obvious abnormality, atraumatic  Eyes:    PERRL, conjunctiva/corneas clear, EOM's intact, fundi    benign, both eyes       Ears:    Normal TM's and external ear canals, both ears  Nose:  Nares normal, septum midline, mucosa normal, no drainage   or sinus tenderness  Throat:   Lips, mucosa, and tongue normal; teeth and gums normal  Neck:   Supple, symmetrical, trachea midline, no adenopathy;       thyroid:  No enlargement/tenderness/nodules; no carotid   bruit or JVD  Back:     Symmetric, no curvature, ROM normal, no CVA tenderness  Lungs:     Clear to auscultation bilaterally, respirations unlabored  Chest wall:    No tenderness or deformity  Heart:    Bradycardic. Normal rhythm. No murmurs, rubs, or gallops.  S1 and S2 normal  Abdomen:     Soft, non-tender, bowel sounds active all four quadrants,    no masses, no organomegaly  Genitalia:    deferred  Rectal:    deferred  Extremities:   All extremities are intact. No cyanosis or edema  Pulses:   2+ and symmetric all extremities  Skin:   Skin color, texture, turgor normal, no rashes or lesions  Lymph nodes:   Cervical, supraclavicular, and axillary nodes normal  Neurologic:   CNII-XII intact. Normal strength, sensation and reflexes      throughout     Depression Screen  PHQ 2/9 Scores 08/23/2019 08/22/2018 06/08/2018  PHQ - 2 Score 0 0 0  PHQ- 9 Score 0 0 -    No results found for any visits  on 08/23/19.  Assessment & Plan    Routine Health Maintenance and Physical Exam  Exercise Activities and Dietary recommendations Goals    . Exercise 150 minutes per week (moderate activity)       Immunization History  Administered Date(s) Administered  . Influenza,inj,Quad PF,6+ Mos 12/27/2016, 11/19/2018  . Td 05/20/2013  . Tdap 05/20/2013  . Zoster Recombinat (Shingrix) 08/22/2018, 11/19/2018    Health Maintenance  Topic Date Due  . PNEUMOCOCCAL POLYSACCHARIDE VACCINE AGE 87-64 HIGH RISK  Never done  . COVID-19 Vaccine (1) Never done  . HIV Screening  Never done  . FOOT EXAM  09/26/2017  . OPHTHALMOLOGY EXAM  08/28/2019  . INFLUENZA VACCINE  10/20/2019  . HEMOGLOBIN A1C  12/11/2019  . TETANUS/TDAP  05/21/2023  . COLONOSCOPY  12/09/2026  . Hepatitis C Screening  Completed    Discussed health benefits of physical activity, and encouraged him to engage in regular exercise appropriate for his age and condition.    No follow-ups on file.    Hypertension: Much better controlled since increasing losartan-hctz. Follow up diabetes in 2 months.  The entirety of the information documented in the History of Present Illness, Review of Systems and Physical Exam were personally obtained by me. Portions of this information were initially documented by the CMA and reviewed by me for thoroughness and accuracy.      Mila Merry, MD  [facility] [phone] (phone) [phone] (fax)  [facility]

## 2019-08-23 NOTE — Telephone Encounter (Signed)
Copied from CRM [id number]. Topic: General - Inquiry >> Aug 23, 2019  1:00 PM Leafy Ro wrote: Reason for CRM:pt saw dr Sherrie Mustache today for a cpe. Pt would like to know on his AVS it has encounter for HIV listed. >> Aug 23, 2019  1:51 PM Leafy Ro wrote: Pt is just questioning why HIV is on  his AVS encounter

## 2019-08-23 NOTE — Patient Instructions (Signed)
Preventive Care 41-60 Years Old, Male Preventive care refers to lifestyle choices and visits with your health care provider that can promote health and wellness. This includes:  A yearly physical exam. This is also called an annual well check.  Regular dental and eye exams.  Immunizations.  Screening for certain conditions.  Healthy lifestyle choices, such as eating a healthy diet, getting regular exercise, not using drugs or products that contain nicotine and tobacco, and limiting alcohol use. What can I expect for my preventive care visit? Physical exam Your health care provider will check:  Height and weight. These may be used to calculate body mass index (BMI), which is a measurement that tells if you are at a healthy weight.  Heart rate and blood pressure.  Your skin for abnormal spots. Counseling Your health care provider may ask you questions about:  Alcohol, tobacco, and drug use.  Emotional well-being.  Home and relationship well-being.  Sexual activity.  Eating habits.  Work and work Statistician. What immunizations do I need?  Influenza (flu) vaccine  This is recommended every year. Tetanus, diphtheria, and pertussis (Tdap) vaccine  You may need a Td booster every 10 years. Varicella (chickenpox) vaccine  You may need this vaccine if you have not already been vaccinated. Zoster (shingles) vaccine  You may need this after age 64. Measles, mumps, and rubella (MMR) vaccine  You may need at least one dose of MMR if you were born in 1957 or later. You may also need a second dose. Pneumococcal conjugate (PCV13) vaccine  You may need this if you have certain conditions and were not previously vaccinated. Pneumococcal polysaccharide (PPSV23) vaccine  You may need one or two doses if you smoke cigarettes or if you have certain conditions. Meningococcal conjugate (MenACWY) vaccine  You may need this if you have certain conditions. Hepatitis A  vaccine  You may need this if you have certain conditions or if you travel or work in places where you may be exposed to hepatitis A. Hepatitis B vaccine  You may need this if you have certain conditions or if you travel or work in places where you may be exposed to hepatitis B. Haemophilus influenzae type b (Hib) vaccine  You may need this if you have certain risk factors. Human papillomavirus (HPV) vaccine  If recommended by your health care provider, you may need three doses over 6 months. You may receive vaccines as individual doses or as more than one vaccine together in one shot (combination vaccines). Talk with your health care provider about the risks and benefits of combination vaccines. What tests do I need? Blood tests  Lipid and cholesterol levels. These may be checked every 5 years, or more frequently if you are over 60 years old.  Hepatitis C test.  Hepatitis B test. Screening  Lung cancer screening. You may have this screening every year starting at age 43 if you have a 30-pack-year history of smoking and currently smoke or have quit within the past 15 years.  Prostate cancer screening. Recommendations will vary depending on your family history and other risks.  Colorectal cancer screening. All adults should have this screening starting at age 72 and continuing until age 2. Your health care provider may recommend screening at age 14 if you are at increased risk. You will have tests every 1-10 years, depending on your results and the type of screening test.  Diabetes screening. This is done by checking your blood sugar (glucose) after you have not eaten  for a while (fasting). You may have this done every 1-3 years.  Sexually transmitted disease (STD) testing. Follow these instructions at home: Eating and drinking  Eat a diet that includes fresh fruits and vegetables, whole grains, lean protein, and low-fat dairy products.  Take vitamin and mineral supplements as  recommended by your health care provider.  Do not drink alcohol if your health care provider tells you not to drink.  If you drink alcohol: ? Limit how much you have to 0-2 drinks a day. ? Be aware of how much alcohol is in your drink. In the [place] one drink equals one 12 oz bottle of beer (355 mL), one 5 oz glass of wine (148 mL), or one 1 oz glass of hard liquor (44 mL). Lifestyle  Take daily care of your teeth and gums.  Stay active. Exercise for at least 30 minutes on 5 or more days each week.  Do not use any products that contain nicotine or tobacco, such as cigarettes, e-cigarettes, and chewing tobacco. If you need help quitting, ask your health care provider.  If you are sexually active, practice safe sex. Use a condom or other form of protection to prevent STIs (sexually transmitted infections).  Talk with your health care provider about taking a low-dose aspirin every day starting at age 53. What's next?  Go to your health care provider once a year for a well check visit.  Ask your health care provider how often you should have your eyes and teeth checked.  Stay up to date on all vaccines. This information is not intended to replace advice given to you by your health care provider. Make sure you discuss any questions you have with your health care provider. Document Revised: 03/01/2018 Document Reviewed: 03/01/2018 Elsevier Patient Education  2020 [employer].

## 2019-08-23 NOTE — Progress Notes (Deleted)
  General: Appearance:    {obese:[phone]::"Obese"} male in no acute distress  Eyes:    PERRL, conjunctiva/corneas clear, EOM's intact       Lungs:     Clear to auscultation bilaterally, respirations unlabored  Heart:    Bradycardic. Normal rhythm. No murmurs, rubs, or gallops.   MS:   All extremities are intact.   Neurologic:   Awake, alert, oriented x 3. No apparent focal neurological           defect.

## 2019-08-29 LAB — HM DIABETES EYE EXAM

## 2019-09-09 ENCOUNTER — Other Ambulatory Visit: Payer: Self-pay | Admitting: Family Medicine

## 2019-09-09 DIAGNOSIS — E790 Hyperuricemia without signs of inflammatory arthritis and tophaceous disease: Secondary | ICD-10-CM

## 2019-10-22 NOTE — Progress Notes (Signed)
I,Roshena L Chambers,acting as a scribe for Mila Merry, MD.,have documented all relevant documentation on the behalf of Mila Merry, MD,as directed by  Mila Merry, MD while in the presence of Mila Merry, MD.  Established patient visit   Patient: Benjamin [name]   DOB: 1960-10-09   59 y.o. Male  MRN: [id number] Visit Date: 10/23/2019  Today's healthcare provider: Mila Merry, MD   Chief Complaint  Patient presents with  . Diabetes   Subjective    HPI  Diabetes Mellitus Type II, Follow-up  Lab Results  Component Value Date   HGBA1C 7.3 (A) 10/23/2019   HGBA1C 7.3 (A) 06/10/2019   HGBA1C 6.8 (A) 11/09/2018   Wt Readings from Last 3 Encounters:  10/23/19 209 lb (94.8 kg)  08/23/19 210 lb (95.3 kg)  06/10/19 208 lb 12.8 oz (94.7 kg)   Last seen for diabetes 4 months ago.  Management since then includes counseling patient on improving diet and increasing exercise. He reports good compliance with treatment. He is not having side effects.  Symptoms: No fatigue No foot ulcerations  No appetite changes No nausea  Yes paresthesia of the feet  No polydipsia  No polyuria No visual disturbances   No vomiting     Home blood sugar records: blood sugars are not checked  Episodes of hypoglycemia? No    Current insulin regiment: none Most Recent Eye Exam: 08/29/2019 Current exercise: walking Current diet habits: well balanced  Pertinent Labs: Lab Results  Component Value Date   CHOL 134 06/10/2019   HDL 49 06/10/2019   LDLCALC 65 06/10/2019   TRIG 108 06/10/2019   CHOLHDL 2.7 06/10/2019   Lab Results  Component Value Date   NA 139 06/10/2019   K 3.8 06/10/2019   CREATININE 1.01 06/10/2019   GFRNONAA 81 06/10/2019   GFRAA 94 06/10/2019   GLUCOSE 147 (H) 06/10/2019     ---------------------------------------------------------------------------------------------------     Medications: Outpatient Medications Prior to Visit  Medication Sig  .  acebutolol (SECTRAL) 400 MG capsule Take 1 capsule (400 mg total) by mouth daily.  Marland Kitchen allopurinol (ZYLOPRIM) 300 MG tablet Take 1 tablet (300 mg total) by mouth daily.  Marland Kitchen losartan-hydrochlorothiazide (HYZAAR) 100-12.5 MG tablet Take 1 tablet by mouth daily.  . metFORMIN (GLUCOPHAGE-XR) 500 MG 24 hr tablet Take 1 tablet (500 mg total) by mouth daily with breakfast.  . omeprazole (PRILOSEC) 20 MG capsule Take 20 mg by mouth daily.  . polyethylene glycol powder (GLYCOLAX/MIRALAX) powder Take 17 g by mouth daily.  . pramoxine-hydrocortisone (PROCTOCREAM-HC) 1-1 % rectal cream Place 1 application rectally 2 (two) times daily.  . pravastatin (PRAVACHOL) 40 MG tablet Take 1 tablet (40 mg total) by mouth daily.  . tadalafil (CIALIS) 10 MG tablet Take 1 tablet (10 mg total) by mouth every other day as needed for erectile dysfunction.   No facility-administered medications prior to visit.    Review of Systems  Constitutional: Negative for appetite change, chills and fever.  Respiratory: Negative for chest tightness, shortness of breath and wheezing.   Cardiovascular: Negative for chest pain and palpitations.  Gastrointestinal: Negative for abdominal pain, nausea and vomiting.  Neurological: Positive for numbness (tingling in feet).      Objective    BP 140/82 (BP Location: Right Arm, Cuff Size: Large)   Pulse (!) 48   Temp 97.9 F (36.6 C) (Temporal)   Resp 16   Wt 209 lb (94.8 kg)   SpO2 97% Comment: room air  BMI 29.99  kg/m    Physical Exam  General appearance:  Overweight male, cooperative and in no acute distress Head: Normocephalic, without obvious abnormality, atraumatic Respiratory: Respirations even and unlabored, normal respiratory rate Extremities: All extremities are intact.  Diabetic foot exam was performed with the following findings:   No deformities, ulcerations, or other skin breakdown Normal sensation of 10g monofilament Intact posterior tibialis and dorsalis pedis  pulses      Results for orders placed or performed in visit on 10/23/19  POCT HgB A1C  Result Value Ref Range   Hemoglobin A1C 7.3 (A) 4.0 - 5.6 %   Est. average glucose Bld gHb Est-mCnc 163     Assessment & Plan     1. Diabetes mellitus without complication (HCC) Encouraged to be more strict with diet and start exercising more consistently. Continue current dose of metformin for now. Suspect he may have mild neuropathy based on history, which may be related to metformin. Recommend he start daily vitamin b12 supplement.   2. Numbness and tingling of both feet Recommend 1000mg  vitamin b12 daily.   Future Appointments  Date Time Provider Department Center  02/25/2020  8:00 AM 14/09/2019, MD [facility]  08/25/2020  9:00 AM 10/25/2020, MD [facility]         The entirety of the information documented in the History of Present Illness, Review of Systems and Physical Exam were personally obtained by me. Portions of this information were initially documented by the CMA and reviewed by me for thoroughness and accuracy.      Malva Limes, MD  [facility] [phone] (phone) [phone] (fax)  [facility]

## 2019-10-23 ENCOUNTER — Ambulatory Visit: Payer: No Typology Code available for payment source | Admitting: Family Medicine

## 2019-10-23 ENCOUNTER — Other Ambulatory Visit: Payer: Self-pay

## 2019-10-23 ENCOUNTER — Encounter: Payer: Self-pay | Admitting: Family Medicine

## 2019-10-23 VITALS — BP 140/82 | HR 48 | Temp 97.9°F | Resp 16 | Wt 209.0 lb

## 2019-10-23 DIAGNOSIS — R202 Paresthesia of skin: Secondary | ICD-10-CM | POA: Diagnosis not present

## 2019-10-23 DIAGNOSIS — R200 Anesthesia of skin: Secondary | ICD-10-CM | POA: Diagnosis not present

## 2019-10-23 DIAGNOSIS — E119 Type 2 diabetes mellitus without complications: Secondary | ICD-10-CM

## 2019-10-23 LAB — POCT GLYCOSYLATED HEMOGLOBIN (HGB A1C)
Est. average glucose Bld gHb Est-mCnc: 163
Hemoglobin A1C: 7.3 % — AB (ref 4.0–5.6)

## 2019-10-23 NOTE — Patient Instructions (Addendum)
.   Please review the attached list of medications and notify my office if there are any errors.   . Try taking OTC vitamin B-12 1000mg  every day to help with help with neuropathy in your feet  . It is recommended to engage in 150 minutes of moderate exercise every week.

## 2019-11-10 ENCOUNTER — Other Ambulatory Visit: Payer: Self-pay | Admitting: Family Medicine

## 2019-11-10 DIAGNOSIS — E119 Type 2 diabetes mellitus without complications: Secondary | ICD-10-CM

## 2019-12-04 ENCOUNTER — Other Ambulatory Visit: Payer: Self-pay | Admitting: Family Medicine

## 2019-12-04 DIAGNOSIS — E790 Hyperuricemia without signs of inflammatory arthritis and tophaceous disease: Secondary | ICD-10-CM

## 2020-01-23 ENCOUNTER — Telehealth: Payer: Self-pay | Admitting: Family Medicine

## 2020-01-23 NOTE — Telephone Encounter (Signed)
Patient is calling to see if he had his flu shot. Please advise Cb- [phone]

## 2020-01-23 NOTE — Telephone Encounter (Signed)
We do not have a flu vaccine on file. Tried calling patient and no answer. Ok for [facility] to advise.

## 2020-01-24 NOTE — Telephone Encounter (Signed)
Patient advised.

## 2020-02-10 ENCOUNTER — Other Ambulatory Visit: Payer: Self-pay | Admitting: Family Medicine

## 2020-02-25 ENCOUNTER — Other Ambulatory Visit: Payer: Self-pay

## 2020-02-25 ENCOUNTER — Encounter: Payer: Self-pay | Admitting: Family Medicine

## 2020-02-25 ENCOUNTER — Ambulatory Visit: Payer: No Typology Code available for payment source | Admitting: Family Medicine

## 2020-02-25 VITALS — BP 134/78 | HR 49 | Temp 98.3°F | Resp 16 | Wt 210.0 lb

## 2020-02-25 DIAGNOSIS — I10 Essential (primary) hypertension: Secondary | ICD-10-CM | POA: Diagnosis not present

## 2020-02-25 DIAGNOSIS — E1140 Type 2 diabetes mellitus with diabetic neuropathy, unspecified: Secondary | ICD-10-CM

## 2020-02-25 LAB — POCT GLYCOSYLATED HEMOGLOBIN (HGB A1C)
Est. average glucose Bld gHb Est-mCnc: 180
Hemoglobin A1C: 7.9 % — AB (ref 4.0–5.6)

## 2020-02-25 MED ORDER — METFORMIN HCL ER 500 MG PO TB24: 500.0000 mg | ORAL_TABLET | Freq: Every day | ORAL | Status: DC

## 2020-02-25 MED ORDER — METFORMIN HCL ER 500 MG PO TB24: 1000.0000 mg | ORAL_TABLET | Freq: Every day | ORAL | Status: DC

## 2020-02-25 NOTE — Progress Notes (Signed)
Established patient visit   Patient: Benjamin [name]   DOB: 1961/01/10   59 y.o. Male  MRN: [id number] Visit Date: 02/25/2020  Today's healthcare provider: Mila Merry, MD   Chief Complaint  Patient presents with  . Diabetes  . Hypertension   Subjective    HPI  Diabetes Mellitus Type II, Follow-up  Lab Results  Component Value Date   HGBA1C 7.9 (A) 02/25/2020   HGBA1C 7.3 (A) 10/23/2019   HGBA1C 7.3 (A) 06/10/2019   Wt Readings from Last 3 Encounters:  02/25/20 210 lb (95.3 kg)  10/23/19 209 lb (94.8 kg)  08/23/19 210 lb (95.3 kg)   Last seen for diabetes 4 months ago.  Management since then includes encouraging patient to be more strict with diet and start exercising more consistently. Continue current dose of metformin for now. Suspect he may have mild neuropathy based on history, which may be related to metformin. Recommend he start daily vitamin b12 supplement. He reports good compliance with treatment. He is not having side effects.  Symptoms: No fatigue No foot ulcerations  No appetite changes No nausea  No paresthesia of the feet  No polydipsia  No polyuria No visual disturbances   No vomiting     Home blood sugar records: blood sugars are not checked  Episodes of hypoglycemia? No    Current insulin regiment: none Most Recent Eye Exam: 08/29/2019 Current exercise: none Current diet habits: well balanced  Pertinent Labs: Lab Results  Component Value Date   CHOL 134 06/10/2019   HDL 49 06/10/2019   LDLCALC 65 06/10/2019   TRIG 108 06/10/2019   CHOLHDL 2.7 06/10/2019   Lab Results  Component Value Date   NA 139 06/10/2019   K 3.8 06/10/2019   CREATININE 1.01 06/10/2019   GFRNONAA 81 06/10/2019   GFRAA 94 06/10/2019   GLUCOSE 147 (H) 06/10/2019     ---------------------------------------------------------------------------------------------------  Hypertension, follow-up  BP Readings from Last 3 Encounters:  02/25/20 134/78    10/23/19 140/82  08/23/19 124/80   Wt Readings from Last 3 Encounters:  02/25/20 210 lb (95.3 kg)  10/23/19 209 lb (94.8 kg)  08/23/19 210 lb (95.3 kg)     He was last seen for hypertension 6 months ago.  BP at that visit was 124/80. Management since that visit includes continue same medication.  He reports good compliance with treatment. He is not having side effects.  He is following a Regular diet. He is not exercising. He does not smoke.  Use of agents associated with hypertension: none.   Outside blood pressures are not checked. Symptoms: No chest pain No chest pressure  No palpitations No syncope  No dyspnea No orthopnea  No paroxysmal nocturnal dyspnea No lower extremity edema   Pertinent labs: Lab Results  Component Value Date   CHOL 134 06/10/2019   HDL 49 06/10/2019   LDLCALC 65 06/10/2019   TRIG 108 06/10/2019   CHOLHDL 2.7 06/10/2019   Lab Results  Component Value Date   NA 139 06/10/2019   K 3.8 06/10/2019   CREATININE 1.01 06/10/2019   GFRNONAA 81 06/10/2019   GFRAA 94 06/10/2019   GLUCOSE 147 (H) 06/10/2019     The 10-year ASCVD risk score Denman George DC Jr., et al., 2013) is: 23.4%   ---------------------------------------------------------------------------------------------------     Medications: Outpatient Medications Prior to Visit  Medication Sig  . acebutolol (SECTRAL) 400 MG capsule Take 1 capsule (400 mg total) by mouth daily.  Marland Kitchen allopurinol (ZYLOPRIM)  300 MG tablet Take 1 tablet (300 mg total) by mouth daily.  . cyanocobalamin 100 MCG tablet Take 100 mcg by mouth daily.  . Lansoprazole (PREVACID PO) Take 1 tablet by mouth daily as needed.  Marland Kitchen losartan-hydrochlorothiazide (HYZAAR) 100-12.5 MG tablet Take 1 tablet by mouth daily.  . metFORMIN (GLUCOPHAGE-XR) 500 MG 24 hr tablet Take 1 tablet (500 mg total) by mouth daily with breakfast.  . polyethylene glycol powder (GLYCOLAX/MIRALAX) powder Take 17 g by mouth daily.  .  pramoxine-hydrocortisone (PROCTOCREAM-HC) 1-1 % rectal cream Place 1 application rectally 2 (two) times daily.  . pravastatin (PRAVACHOL) 40 MG tablet Take 1 tablet (40 mg total) by mouth daily.  . tadalafil (CIALIS) 10 MG tablet Take 1 tablet (10 mg total) by mouth every other day as needed for erectile dysfunction.   No facility-administered medications prior to visit.    Review of Systems  Constitutional: Negative for appetite change, chills and fever.  Respiratory: Negative for chest tightness, shortness of breath and wheezing.   Cardiovascular: Negative for chest pain and palpitations.  Gastrointestinal: Negative for abdominal pain, nausea and vomiting.      Objective    BP 134/78 (BP Location: Left Arm, Patient Position: Sitting, Cuff Size: Normal)   Pulse (!) 49   Temp 98.3 F (36.8 C) (Oral)   Resp 16   Wt 210 lb (95.3 kg)   BMI 30.13 kg/m    Physical Exam   General: Appearance:    Mildly obese male in no acute distress  Eyes:    PERRL, conjunctiva/corneas clear, EOM's intact       Lungs:     Clear to auscultation bilaterally, respirations unlabored  Heart:    Bradycardic. Normal rhythm. No murmurs, rubs, or gallops.   MS:   All extremities are intact.   Neurologic:   Awake, alert, oriented x 3. No apparent focal neurological           defect.         Results for orders placed or performed in visit on 02/25/20  POCT HgB A1C  Result Value Ref Range   Hemoglobin A1C 7.9 (A) 4.0 - 5.6 %   Est. average glucose Bld gHb Est-mCnc 180     Assessment & Plan     1. Diabetes mellitus with neuropathy(HCC) A1c continues to rise. Encourage to get 30 minutes of exercise. Increase from 500mg  daily to  metFORMIN (GLUCOPHAGE-XR) 500 MG 24 hr tablet; Take 2 tablets (1000 mg total) by mouth daily with breakfast.   Neuropathy in feet improved since starting B12 supplements which he is to continue.   2. Essential (primary) hypertension Well controlled.  Continue current  medications.    Will need metformin refilled for 180 tablets [facility] in about a month.   Follow up 3 months.      The entirety of the information documented in the History of Present Illness, Review of Systems and Physical Exam were personally obtained by me. Portions of this information were initially documented by the CMA and reviewed by me for thoroughness and accuracy.      , MD  [facility] [phone] (phone) [phone] (fax)  [facility]

## 2020-02-27 ENCOUNTER — Ambulatory Visit: Payer: No Typology Code available for payment source | Admitting: Gastroenterology

## 2020-02-27 ENCOUNTER — Encounter: Payer: Self-pay | Admitting: Gastroenterology

## 2020-02-27 ENCOUNTER — Other Ambulatory Visit: Payer: Self-pay | Admitting: Family Medicine

## 2020-02-27 VITALS — BP 137/76 | HR 59 | Ht 70.0 in | Wt 214.4 lb

## 2020-02-27 DIAGNOSIS — R140 Abdominal distension (gaseous): Secondary | ICD-10-CM | POA: Diagnosis not present

## 2020-02-27 DIAGNOSIS — G8929 Other chronic pain: Secondary | ICD-10-CM | POA: Diagnosis not present

## 2020-02-27 DIAGNOSIS — R1013 Epigastric pain: Secondary | ICD-10-CM

## 2020-02-27 DIAGNOSIS — E790 Hyperuricemia without signs of inflammatory arthritis and tophaceous disease: Secondary | ICD-10-CM

## 2020-02-27 MED ORDER — OMEPRAZOLE 40 MG PO CPDR: 40.0000 mg | DELAYED_RELEASE_CAPSULE | Freq: Every day | ORAL | 3 refills | Status: DC

## 2020-02-27 NOTE — Progress Notes (Signed)
Wyline Mood MD, MRCP(U.K) [address]  [address]  [place], [place] [address]  Main: [phone]  Fax: [phone]   Gastroenterology Consultation  Referring Provider:     Malva Limes, MD Primary Care Physician:  Malva Limes, MD Primary Gastroenterologist:  Dr. Wyline Mood  Reason for Consultation:     Abdominal pain        HPI:   Benjamin [name] is a 59 y.o. y/o male referred for epigastric pain back in 12/09/2018 which was ongoing for a week subsequently referred to see me.  I performed a colonoscopy in 12/08/2016 for colon cancer screening which was normal except for diverticulosis of the right colon and internal hemorrhoids.  Labs in 12/09/2019: Hemoglobin 13.7 CMP was normal except elevated glucose of 128 lipase of 29 which was normal. Since September 2020 he has had on and off epigastric pain nonradiating sharp in nature.  When he takes Prilosec 20 mg has significant improvement in the pain.  No NSAID use.  No weight loss.  No history of smoking.  No other complaints.  Not related to intake of food.  Has some bloating.  Does consume some diet soda and regular soda.   Past Medical History:  Diagnosis Date  . Diabetes mellitus without complication (HCC)   . Gastrocnemius muscle tear 05/12/2015  . Hypertension     Past Surgical History:  Procedure Laterality Date  . COLONOSCOPY WITH PROPOFOL N/A 12/08/2016   Procedure: COLONOSCOPY WITH PROPOFOL;  Surgeon: Wyline Mood, MD;  Location: [facility] ENDOSCOPY;  Service: Gastroenterology;  Laterality: N/A;    Prior to Admission medications   Medication Sig Start Date End Date Taking? Authorizing Provider  acebutolol (SECTRAL) 400 MG capsule Take 1 capsule (400 mg total) by mouth daily. 06/10/19   Malva Limes, MD  allopurinol (ZYLOPRIM) 300 MG tablet Take 1 tablet (300 mg total) by mouth daily. 12/04/19   Malva Limes, MD  cyanocobalamin 100 MCG tablet Take 100 mcg by mouth daily.    [provider]   Lansoprazole (PREVACID PO) Take 1 tablet by mouth daily as needed.    [provider]  losartan-hydrochlorothiazide (HYZAAR) 100-12.5 MG tablet Take 1 tablet by mouth daily. 06/10/19   Malva Limes, MD  metFORMIN (GLUCOPHAGE-XR) 500 MG 24 hr tablet Take 2 tablets (1,000 mg total) by mouth daily with breakfast. 02/25/20   Malva Limes, MD  polyethylene glycol powder (GLYCOLAX/MIRALAX) powder Take 17 g by mouth daily. 03/19/15   Lorie Phenix, MD  pramoxine-hydrocortisone (PROCTOCREAM-HC) 1-1 % rectal cream Place 1 application rectally 2 (two) times daily. 11/09/18   Malva Limes, MD  pravastatin (PRAVACHOL) 40 MG tablet Take 1 tablet (40 mg total) by mouth daily. 06/10/19   Malva Limes, MD  tadalafil (CIALIS) 10 MG tablet Take 1 tablet (10 mg total) by mouth every other day as needed for erectile dysfunction. 02/10/20   Malva Limes, MD    Family History  Problem Relation Age of Onset  . Diabetes Mother   . Heart disease Mother   . Colon cancer Father   . Cancer Sister        Bone Cancer  . Heart disease Brother   . Diabetes Brother      Social History   Tobacco Use  . Smoking status: Never Smoker  . Smokeless tobacco: Never Used  Vaping Use  . Vaping Use: Never used  Substance Use Topics  . Alcohol use: No  . Drug  use: No    Allergies as of 02/27/2020  . (No Known Allergies)    Review of Systems:    All systems reviewed and negative except where noted in HPI.   Physical Exam:  BP 137/76 (BP Location: Left Arm, Patient Position: Sitting, Cuff Size: Normal)   Pulse (!) 59   Ht 5\' 10"  (1.778 m)   Wt 214 lb 6.4 oz (97.3 kg)   BMI 30.76 kg/m  No LMP for male patient. Psych:  Alert and cooperative. Normal mood and affect. General:   Alert,  Well-developed, well-nourished, pleasant and cooperative in NAD Head:  Normocephalic and atraumatic. Eyes:  Sclera clear, no icterus.   Conjunctiva pink. Abdomen:  Normal bowel sounds.  No bruits.  Soft,  non-tender and non-distended without masses, hepatosplenomegaly or hernias noted.  No guarding or rebound tenderness.    Neurologic:  Alert and oriented x3;  grossly normal neurologically. Psych:  Alert and cooperative. Normal mood and affect.  Imaging Studies: No results found.  Assessment and Plan:   Benjamin [name] is a 59 y.o. y/o male has been referred for abdominal pain.  Epigastric in location significant improvement with low-dose Prilosec 20 mg a day.  Pain recurs if he does not take the Prilosec.  Some bloating which could be related to consumption of soda.  Plan 1.  Increase to Prilosec 40 mg once a day 30 minutes before breakfast prescription will be provided 2.  Low FODMAP diet 3.  H. pylori breath test 4.  If no better in 4 weeks time will consider endoscopic evaluation.  If bloating persists would consider treatment with activated charcoal and antibiotics.  Follow up in 4 to 5 weeks telephone visit  Dr 67 MD,MRCP(U.K)

## 2020-02-27 NOTE — Patient Instructions (Signed)
Low-FODMAP Eating Plan  FODMAPs (fermentable oligosaccharides, disaccharides, monosaccharides, and polyols) are sugars that are hard for some people to digest. A low-FODMAP eating plan may help some people who have bowel (intestinal) diseases to manage their symptoms. This meal plan can be complicated to follow. Work with a diet and nutrition specialist (dietitian) to make a low-FODMAP eating plan that is right for you. A dietitian can make sure that you get enough nutrition from this diet. What are tips for following this plan? Reading food labels  Check labels for hidden FODMAPs such as: ? High-fructose syrup. ? Honey. ? Agave. ? Natural fruit flavors. ? Onion or garlic powder.  Choose low-FODMAP foods that contain 3-4 grams of fiber per serving.  Check food labels for serving sizes. Eat only one serving at a time to make sure FODMAP levels stay low. Meal planning  Follow a low-FODMAP eating plan for up to 6 weeks, or as told by your health care provider or dietitian.  To follow the eating plan: 1. Eliminate high-FODMAP foods from your diet completely. 2. Gradually reintroduce high-FODMAP foods into your diet one at a time. Most people should wait a few days after introducing one high-FODMAP food before they introduce the next high-FODMAP food. Your dietitian can recommend how quickly you may reintroduce foods. 3. Keep a daily record of what you eat and drink, and make note of any symptoms that you have after eating. 4. Review your daily record with a dietitian regularly. Your dietitian can help you identify which foods you can eat and which foods you should avoid. General tips  Drink enough fluid each day to keep your urine pale yellow.  Avoid processed foods. These often have added sugar and may be high in FODMAPs.  Avoid most dairy products, whole grains, and sweeteners.  Work with a dietitian to make sure you get enough fiber in your diet. Recommended  foods Grains  Gluten-free grains, such as rice, oats, buckwheat, quinoa, corn, polenta, and millet. Gluten-free pasta, bread, or cereal. Rice noodles. Corn tortillas. Vegetables  Eggplant, zucchini, cucumber, peppers, green beans, Brussels sprouts, bean sprouts, lettuce, arugula, kale, Swiss chard, spinach, collard greens, bok choy, summer squash, potato, and tomato. Limited amounts of corn, carrot, and sweet potato. Green parts of scallions. Fruits  Bananas, oranges, lemons, limes, blueberries, raspberries, strawberries, grapes, cantaloupe, honeydew melon, kiwi, papaya, passion fruit, and pineapple. Limited amounts of dried cranberries, banana chips, and shredded coconut. Dairy  Lactose-free milk, yogurt, and kefir. Lactose-free cottage cheese and ice cream. Non-dairy milks, such as almond, coconut, hemp, and rice milk. Yogurts made of non-dairy milks. Limited amounts of goat cheese, brie, mozzarella, parmesan, swiss, and other hard cheeses. Meats and other protein foods  Unseasoned beef, pork, poultry, or fish. Eggs. Bacon. Tofu (firm) and tempeh. Limited amounts of nuts and seeds, such as almonds, walnuts, brazil nuts, pecans, peanuts, pumpkin seeds, chia seeds, and sunflower seeds. Fats and oils  Butter-free spreads. Vegetable oils, such as olive, canola, and sunflower oil. Seasoning and other foods  Artificial sweeteners with names that do not end in "ol" such as aspartame, saccharine, and stevia. Maple syrup, white table sugar, raw sugar, brown sugar, and molasses. Fresh basil, coriander, parsley, rosemary, and thyme. Beverages  Water and mineral water. Sugar-sweetened soft drinks. Small amounts of orange juice or cranberry juice. Black and green tea. Most dry wines. Coffee. This may not be a complete list of low-FODMAP foods. Talk with your dietitian for more information. Foods to avoid Grains  Wheat,   including kamut, durum, and semolina. Barley and bulgur. Couscous. Wheat-based  cereals. Wheat noodles, bread, crackers, and pastries. Vegetables  Chicory root, artichoke, asparagus, cabbage, snow peas, sugar snap peas, mushrooms, and cauliflower. Onions, garlic, leeks, and the white part of scallions. Fruits  Fresh, dried, and juiced forms of apple, pear, watermelon, peach, plum, cherries, apricots, blackberries, boysenberries, figs, nectarines, and mango. Avocado. Dairy  Milk, yogurt, ice cream, and soft cheese. Cream and sour cream. Milk-based sauces. Custard. Meats and other protein foods  Fried or fatty meat. Sausage. Cashews and pistachios. Soybeans, baked beans, black beans, chickpeas, kidney beans, fava beans, navy beans, lentils, and split peas. Seasoning and other foods  Any sugar-free gum or candy. Foods that contain artificial sweeteners such as sorbitol, mannitol, isomalt, or xylitol. Foods that contain honey, high-fructose corn syrup, or agave. Bouillon, vegetable stock, beef stock, and chicken stock. Garlic and onion powder. Condiments made with onion, such as hummus, chutney, pickles, relish, salad dressing, and salsa. Tomato paste. Beverages  Chicory-based drinks. Coffee substitutes. Chamomile tea. Fennel tea. Sweet or fortified wines such as port or sherry. Diet soft drinks made with isomalt, mannitol, maltitol, sorbitol, or xylitol. Apple, pear, and mango juice. Juices with high-fructose corn syrup. This may not be a complete list of high-FODMAP foods. Talk with your dietitian to discuss what dietary choices are best for you.  Summary  A low-FODMAP eating plan is a short-term diet that eliminates FODMAPs from your diet to help ease symptoms of certain bowel diseases.  The eating plan usually lasts up to 6 weeks. After that, high-FODMAP foods are restarted gradually, one at a time, so you can find out which may be causing symptoms.  A low-FODMAP eating plan can be complicated. It is best to work with a dietitian who has experience with this type of  plan. This information is not intended to replace advice given to you by your health care provider. Make sure you discuss any questions you have with your health care provider. Document Revised: 02/17/2017 Document Reviewed: 11/01/2016 Elsevier Patient Education  2020 Elsevier Inc.  

## 2020-02-29 LAB — H. PYLORI BREATH TEST: H pylori Breath Test: NEGATIVE

## 2020-03-02 ENCOUNTER — Other Ambulatory Visit: Payer: Self-pay

## 2020-03-02 DIAGNOSIS — E785 Hyperlipidemia, unspecified: Secondary | ICD-10-CM

## 2020-03-02 DIAGNOSIS — I10 Essential (primary) hypertension: Secondary | ICD-10-CM

## 2020-03-02 DIAGNOSIS — E790 Hyperuricemia without signs of inflammatory arthritis and tophaceous disease: Secondary | ICD-10-CM

## 2020-03-02 MED ORDER — ACEBUTOLOL HCL 400 MG PO CAPS: 400.0000 mg | ORAL_CAPSULE | Freq: Every day | ORAL | 3 refills | Status: DC

## 2020-03-02 MED ORDER — PRAVASTATIN SODIUM 40 MG PO TABS: 40.0000 mg | ORAL_TABLET | Freq: Every day | ORAL | 3 refills | Status: DC

## 2020-03-02 MED ORDER — LOSARTAN POTASSIUM-HCTZ 100-12.5 MG PO TABS
1.0000 | ORAL_TABLET | Freq: Every day | ORAL | 3 refills | Status: DC
Start: 2020-03-02 — End: 2021-03-10

## 2020-03-02 MED ORDER — TADALAFIL 10 MG PO TABS: 10.0000 mg | ORAL_TABLET | ORAL | 3 refills | Status: DC | PRN

## 2020-03-02 NOTE — Telephone Encounter (Signed)
Please review. Thanks!  

## 2020-03-02 NOTE — Telephone Encounter (Signed)
Patient is requesting refills on  Acebutolol 400 mg. Allopurinol 300 mg.  Iosartan-HCTZ 100-12.5 mg. Pravastatin 40 mg.  Tadalafil 10 mg.    Send to [facility]

## 2020-03-05 ENCOUNTER — Encounter: Payer: Self-pay | Admitting: Gastroenterology

## 2020-03-23 ENCOUNTER — Other Ambulatory Visit: Payer: Self-pay | Admitting: Family Medicine

## 2020-03-23 DIAGNOSIS — E1140 Type 2 diabetes mellitus with diabetic neuropathy, unspecified: Secondary | ICD-10-CM

## 2020-03-23 MED ORDER — METFORMIN HCL ER 500 MG PO TB24
1000.0000 mg | ORAL_TABLET | Freq: Every day | ORAL | 4 refills | Status: DC
Start: 2020-03-23 — End: 2020-10-01

## 2020-04-02 ENCOUNTER — Telehealth (INDEPENDENT_AMBULATORY_CARE_PROVIDER_SITE_OTHER): Payer: No Typology Code available for payment source | Admitting: Gastroenterology

## 2020-04-02 DIAGNOSIS — R1013 Epigastric pain: Secondary | ICD-10-CM

## 2020-04-02 DIAGNOSIS — G8929 Other chronic pain: Secondary | ICD-10-CM

## 2020-04-02 DIAGNOSIS — R140 Abdominal distension (gaseous): Secondary | ICD-10-CM

## 2020-04-02 NOTE — Progress Notes (Signed)
Wyline Mood , MD [address]  [address]  [place], [place] [address]  Main: [phone]  Fax: [phone]   Primary Care Physician: Malva Limes, MD  Virtual Visit via Telephone Note  I connected with patient on 04/02/20 at  1:15 PM EST by telephone and verified that I am speaking with the correct person using two identifiers.   I discussed the limitations, risks, security and privacy concerns of performing an evaluation and management service by telephone and the availability of in person appointments. I also discussed with the patient that there may be a patient responsible charge related to this service. The patient expressed understanding and agreed to proceed.  Location of Patient: Home Location of Provider: Home Persons involved: Patient and provider only   History of Present Illness:   Abdominal pain follow up   HPI: Benjamin [name] is a 60 y.o. male   Summary of history :   Benjamin [name] is a 60 y.o. y/o male referred for epigastric pain back in 12/09/2018 which was ongoing for a week subsequently referred to see me.  I performed a colonoscopy in 12/08/2016 for colon cancer screening which was normal except for diverticulosis of the right colon and internal hemorrhoids.  Labs in 12/09/2019: Hemoglobin 13.7 CMP was normal except elevated glucose of 128 lipase of 29 which was normal.  Since September 2020 he has had on and off epigastric pain nonradiating sharp in nature.  When he takes Prilosec 20 mg has significant improvement in the pain.  No NSAID use.  No weight loss.  No history of smoking.  No other complaints.  Not related to intake of food.  Has some bloating.  Does consume some diet soda and regular soda  Interval history   02/27/2020-04/02/2020  02/27/2020: H pylori breath test negative.   The abdominal pain has resolved since increasing dose of Omeprazole to 40 mg a day , doing the LOW FODMAP diet , no other complaints and overall feels much  better.      Current Outpatient Medications  Medication Sig Dispense Refill  . acebutolol (SECTRAL) 400 MG capsule Take 1 capsule (400 mg total) by mouth daily. 90 capsule 3  . allopurinol (ZYLOPRIM) 300 MG tablet Take 1 tablet (300 mg total) by mouth daily. 90 tablet 2  . cyanocobalamin 100 MCG tablet Take 100 mcg by mouth daily. (Patient not taking: Reported on 02/27/2020)    . Lansoprazole (PREVACID PO) Take 1 tablet by mouth daily as needed.    Marland Kitchen losartan-hydrochlorothiazide (HYZAAR) 100-12.5 MG tablet Take 1 tablet by mouth daily. 90 tablet 3  . metFORMIN (GLUCOPHAGE-XR) 500 MG 24 hr tablet Take 2 tablets (1,000 mg total) by mouth daily with breakfast. 180 tablet 4  . omeprazole (PRILOSEC) 40 MG capsule Take 1 capsule (40 mg total) by mouth daily. 90 capsule 3  . polyethylene glycol powder (GLYCOLAX/MIRALAX) powder Take 17 g by mouth daily. (Patient not taking: Reported on 02/27/2020) 3350 g 1  . pramoxine-hydrocortisone (PROCTOCREAM-HC) 1-1 % rectal cream Place 1 application rectally 2 (two) times daily. (Patient not taking: Reported on 02/27/2020) 30 g 1  . pravastatin (PRAVACHOL) 40 MG tablet Take 1 tablet (40 mg total) by mouth daily. 90 tablet 3  . tadalafil (CIALIS) 10 MG tablet Take 1 tablet (10 mg total) by mouth every other day as needed for erectile dysfunction. 10 tablet 3   No current facility-administered medications for this visit.    Allergies as of 04/02/2020  . (  No Known Allergies)    Review of Systems:    All systems reviewed and negative except where noted in HPI.   Observations/Objective:  Labs: CMP     Component Value Date/Time   NA 139 06/10/2019 0848   K 3.8 06/10/2019 0848   CL 102 06/10/2019 0848   CO2 19 (L) 06/10/2019 0848   GLUCOSE 147 (H) 06/10/2019 0848   BUN 12 06/10/2019 0848   CREATININE 1.01 06/10/2019 0848   CALCIUM 9.4 06/10/2019 0848   PROT 7.4 06/10/2019 0848   ALBUMIN 4.5 06/10/2019 0848   AST 38 06/10/2019 0848   ALT 42  06/10/2019 0848   ALKPHOS 65 06/10/2019 0848   BILITOT 0.6 06/10/2019 0848   GFRNONAA 81 06/10/2019 0848   GFRAA 94 06/10/2019 0848   Lab Results  Component Value Date   WBC 7.4 06/10/2019   HGB 14.1 06/10/2019   HCT 41.3 06/10/2019   MCV 86 06/10/2019   PLT 171 06/10/2019    Imaging Studies: No results found.  Assessment and Plan:   Benjamin [name] is a 60 y.o. y/o male  Here to follow up  for abdominal pain.  Epigastric in location significant improvement after increasing Prilosec to 40- mg a day  Plan 1.  Continue Prilosec 40 mg a day and can consider switching to Famotidine in 8-12 weeks 2. Continue LOW FODMAP diet    F/u as needed    I discussed the assessment and treatment plan with the patient. The patient was provided an opportunity to ask questions and all were answered. The patient agreed with the plan and demonstrated an understanding of the instructions.   The patient was advised to call back or seek an in-person evaluation if the symptoms worsen or if the condition fails to improve as anticipated.  I provided 12 minutes of non-face-to-face time during this encounter.  Dr Wyline Mood MD,MRCP [facility]) Gastroenterology/Hepatology Pager: [phone]   Speech recognition software was used to dictate this note.

## 2020-05-27 ENCOUNTER — Other Ambulatory Visit: Payer: Self-pay

## 2020-05-27 ENCOUNTER — Ambulatory Visit: Payer: No Typology Code available for payment source | Admitting: Family Medicine

## 2020-05-27 ENCOUNTER — Encounter: Payer: Self-pay | Admitting: Family Medicine

## 2020-05-27 VITALS — BP 133/81 | HR 54 | Resp 16 | Wt 207.0 lb

## 2020-05-27 DIAGNOSIS — E119 Type 2 diabetes mellitus without complications: Secondary | ICD-10-CM

## 2020-05-27 DIAGNOSIS — I10 Essential (primary) hypertension: Secondary | ICD-10-CM

## 2020-05-27 DIAGNOSIS — Z125 Encounter for screening for malignant neoplasm of prostate: Secondary | ICD-10-CM | POA: Diagnosis not present

## 2020-05-27 DIAGNOSIS — E1140 Type 2 diabetes mellitus with diabetic neuropathy, unspecified: Secondary | ICD-10-CM | POA: Diagnosis not present

## 2020-05-27 LAB — POCT GLYCOSYLATED HEMOGLOBIN (HGB A1C)
Est. average glucose Bld gHb Est-mCnc: 180
Hemoglobin A1C: 7.9 % — AB (ref 4.0–5.6)

## 2020-05-27 NOTE — Progress Notes (Signed)
Established patient visit   Patient: Benjamin [name]   DOB: Dec 26, 1960   60 y.o. Male  MRN: [id number] Visit Date: 05/27/2020  Today's healthcare provider: Mila Merry, MD   Chief Complaint  Patient presents with  . Diabetes  . Hypertension   Subjective    HPI  Diabetes Mellitus Type II, Follow-up  Lab Results  Component Value Date   HGBA1C 7.9 (A) 02/25/2020   HGBA1C 7.3 (A) 10/23/2019   HGBA1C 7.3 (A) 06/10/2019   Wt Readings from Last 3 Encounters:  05/27/20 207 lb (93.9 kg)  02/27/20 214 lb 6.4 oz (97.3 kg)  02/25/20 210 lb (95.3 kg)   Last seen for diabetes 3 months ago.  Management since then includes encouraging patient to increase exercise. Metformin was also increased from 500mg  daily to 1000mg  daily. He reports excellent compliance with treatment. He is not having side effects.  Symptoms: No fatigue No foot ulcerations  No appetite changes No nausea  No paresthesia of the feet  No polydipsia  No polyuria No visual disturbances   No vomiting     Home blood sugar records: blood sugars are not checked  Episodes of hypoglycemia? No    Current insulin regiment: none Most Recent Eye Exam: 08/29/2019 Current exercise: walking Current diet habits: in general, an "unhealthy" diet  Pertinent Labs: Lab Results  Component Value Date   CHOL 134 06/10/2019   HDL 49 06/10/2019   LDLCALC 65 06/10/2019   TRIG 108 06/10/2019   CHOLHDL 2.7 06/10/2019   Lab Results  Component Value Date   NA 139 06/10/2019   K 3.8 06/10/2019   CREATININE 1.01 06/10/2019   GFRNONAA 81 06/10/2019   GFRAA 94 06/10/2019   GLUCOSE 147 (H) 06/10/2019     ---------------------------------------------------------------------------------------------------  Hypertension, follow-up  BP Readings from Last 3 Encounters:  05/27/20 133/81  02/27/20 137/76  02/25/20 134/78   Wt Readings from Last 3 Encounters:  05/27/20 207 lb (93.9 kg)  02/27/20 214 lb 6.4 oz (97.3 kg)   02/25/20 210 lb (95.3 kg)     He was last seen for hypertension 3 months ago.  BP at that visit was 137/76. Management since that visit includes continue same medication.  He reports good compliance with treatment. He is not having side effects.  He is following a Regular diet. He is exercising. He does not smoke.  Use of agents associated with hypertension: none.   Outside blood pressures are not checked. Symptoms: No chest pain No chest pressure  No palpitations No syncope  No dyspnea No orthopnea  No paroxysmal nocturnal dyspnea No lower extremity edema   Pertinent labs: Lab Results  Component Value Date   CHOL 134 06/10/2019   HDL 49 06/10/2019   LDLCALC 65 06/10/2019   TRIG 108 06/10/2019   CHOLHDL 2.7 06/10/2019   Lab Results  Component Value Date   NA 139 06/10/2019   K 3.8 06/10/2019   CREATININE 1.01 06/10/2019   GFRNONAA 81 06/10/2019   GFRAA 94 06/10/2019   GLUCOSE 147 (H) 06/10/2019     The 10-year ASCVD risk score 06/12/2019 DC Jr., et al., 2013) is: 23.1%   ---------------------------------------------------------------------------------------------------     Medications: Outpatient Medications Prior to Visit  Medication Sig  . acebutolol (SECTRAL) 400 MG capsule Take 1 capsule (400 mg total) by mouth daily.  Denman George allopurinol (ZYLOPRIM) 300 MG tablet Take 1 tablet (300 mg total) by mouth daily.  . cyanocobalamin 100 MCG tablet Take 100 mcg  by mouth daily.  . Lansoprazole (PREVACID PO) Take 1 tablet by mouth daily as needed.  Marland Kitchen losartan-hydrochlorothiazide (HYZAAR) 100-12.5 MG tablet Take 1 tablet by mouth daily.  . metFORMIN (GLUCOPHAGE-XR) 500 MG 24 hr tablet Take 2 tablets (1,000 mg total) by mouth daily with breakfast.  . omeprazole (PRILOSEC) 40 MG capsule Take 1 capsule (40 mg total) by mouth daily.  . polyethylene glycol powder (GLYCOLAX/MIRALAX) powder Take 17 g by mouth daily.  . pramoxine-hydrocortisone (PROCTOCREAM-HC) 1-1 % rectal cream Place  1 application rectally 2 (two) times daily.  . pravastatin (PRAVACHOL) 40 MG tablet Take 1 tablet (40 mg total) by mouth daily.  . tadalafil (CIALIS) 10 MG tablet Take 1 tablet (10 mg total) by mouth every other day as needed for erectile dysfunction.   No facility-administered medications prior to visit.    Review of Systems  Constitutional: Negative for appetite change, chills and fever.  Respiratory: Negative for chest tightness, shortness of breath and wheezing.   Cardiovascular: Negative for chest pain and palpitations.  Gastrointestinal: Negative for abdominal pain, nausea and vomiting.       Objective    BP 133/81 (BP Location: Left Arm, Patient Position: Sitting, Cuff Size: Large)   Pulse (!) 54   Resp 16   Wt 207 lb (93.9 kg)   BMI 29.70 kg/m     Physical Exam    General: Appearance:     Overweight male in no acute distress  Eyes:    PERRL, conjunctiva/corneas clear, EOM's intact       Lungs:     Clear to auscultation bilaterally, respirations unlabored  Heart:    Bradycardic. Normal rhythm. No murmurs, rubs, or gallops.   MS:   All extremities are intact.   Neurologic:   Awake, alert, oriented x 3. No apparent focal neurological           defect.        A1C=7.9%  Assessment & Plan     1. Type 2 diabetes mellitus with diabetic neuropathy, without long-term current use of insulin (HCC) Stable, Continue current medications.  Encourage continue walking 30 minutes every day .   - CBC - Comprehensive metabolic panel - Lipid panel   2. Prostate cancer screening  - PSA Total (Reflex To Free) ([facility] only)  3. Essential (primary) hypertension Well controlled.  Continue current medications.    4. Diabetes mellitus without complication (HCC) .      The entirety of the information documented in the History of Present Illness, Review of Systems and Physical Exam were personally obtained by me. Portions of this information were initially documented by the CMA  and reviewed by me for thoroughness and accuracy.      Mila Merry, MD  [facility] [phone] (phone) [phone] (fax)  [facility]

## 2020-05-28 LAB — LIPID PANEL
Chol/HDL Ratio: 3 ratio (ref 0.0–5.0)
Cholesterol, Total: 138 mg/dL (ref 100–199)
HDL: 46 mg/dL (ref >39)
LDL Chol Calc (NIH): 72 mg/dL (ref 0–99)
Triglycerides: 112 mg/dL (ref 0–149)
VLDL Cholesterol Cal: 20 mg/dL (ref 5–40)

## 2020-05-28 LAB — PSA TOTAL (REFLEX TO FREE): Prostate Specific Ag, Serum: 0.5 ng/mL (ref 0.0–4.0)

## 2020-05-28 LAB — CBC
Hematocrit: 41.2 % (ref 37.5–51.0)
Hemoglobin: 13.9 g/dL (ref 13.0–17.7)
MCH: 29.7 pg (ref 26.6–33.0)
MCHC: 33.7 g/dL (ref 31.5–35.7)
MCV: 88 fL (ref 79–97)
Platelets: 166 10*3/uL (ref 150–450)
RBC: 4.68 x10E6/uL (ref 4.14–5.80)
RDW: 13.1 % (ref 11.6–15.4)
WBC: 7.7 10*3/uL (ref 3.4–10.8)

## 2020-05-28 LAB — COMPREHENSIVE METABOLIC PANEL
ALT: 51 IU/L — ABNORMAL HIGH (ref 0–44)
AST: 47 IU/L — ABNORMAL HIGH (ref 0–40)
Albumin/Globulin Ratio: 1.6 (ref 1.2–2.2)
Albumin: 4.5 g/dL (ref 3.8–4.9)
Alkaline Phosphatase: 68 IU/L (ref 44–121)
BUN/Creatinine Ratio: 14 (ref 10–24)
BUN: 15 mg/dL (ref 8–27)
Bilirubin Total: 0.7 mg/dL (ref 0.0–1.2)
CO2: 23 mmol/L (ref 20–29)
Calcium: 9.1 mg/dL (ref 8.6–10.2)
Chloride: 98 mmol/L (ref 96–106)
Creatinine, Ser: 1.07 mg/dL (ref 0.76–1.27)
Globulin, Total: 2.9 g/dL (ref 1.5–4.5)
Glucose: 173 mg/dL — ABNORMAL HIGH (ref 65–99)
Potassium: 4.3 mmol/L (ref 3.5–5.2)
Sodium: 138 mmol/L (ref 134–144)
Total Protein: 7.4 g/dL (ref 6.0–8.5)
eGFR: 79 mL/min/{1.73_m2} (ref >59)

## 2020-08-25 ENCOUNTER — Other Ambulatory Visit: Payer: Self-pay

## 2020-08-25 ENCOUNTER — Ambulatory Visit (INDEPENDENT_AMBULATORY_CARE_PROVIDER_SITE_OTHER): Payer: No Typology Code available for payment source | Admitting: Family Medicine

## 2020-08-25 ENCOUNTER — Encounter: Payer: Self-pay | Admitting: Family Medicine

## 2020-08-25 VITALS — BP 127/89 | HR 48 | Ht 70.0 in | Wt 201.0 lb

## 2020-08-25 DIAGNOSIS — L723 Sebaceous cyst: Secondary | ICD-10-CM

## 2020-08-25 DIAGNOSIS — I10 Essential (primary) hypertension: Secondary | ICD-10-CM

## 2020-08-25 DIAGNOSIS — E1140 Type 2 diabetes mellitus with diabetic neuropathy, unspecified: Secondary | ICD-10-CM

## 2020-08-25 DIAGNOSIS — Z0000 Encounter for general adult medical examination without abnormal findings: Secondary | ICD-10-CM

## 2020-08-25 DIAGNOSIS — Z136 Encounter for screening for cardiovascular disorders: Secondary | ICD-10-CM | POA: Diagnosis not present

## 2020-08-25 LAB — POCT GLYCOSYLATED HEMOGLOBIN (HGB A1C): Hemoglobin A1C: 8 % — AB (ref 4.0–5.6)

## 2020-08-25 NOTE — Progress Notes (Signed)
Complete physical exam   Patient: Benjamin [name]   DOB: 1960-06-29   60 y.o. Male  MRN: [id number] Visit Date: 08/25/2020  Today's healthcare provider: Mila Merry, MD   Chief Complaint  Patient presents with  . Annual Exam   Subjective    Benjamin [name] is a 60 y.o. male who presents today for a complete physical exam.  He reports consuming a general diet. Exercises regularly He generally feels well. He reports sleeping well. He does have additional problems to discuss today.  Pt states he has a knot on the left side of his scalp that seems to be getting bigger.  Cough since 08/13/2024    HPI  Diabetes Mellitus Type II, Follow-up  Lab Results  Component Value Date   HGBA1C 7.9 (A) 05/27/2020   HGBA1C 7.9 (A) 02/25/2020   HGBA1C 7.3 (A) 10/23/2019   Wt Readings from Last 3 Encounters:  08/25/20 201 lb (91.2 kg)  05/27/20 207 lb (93.9 kg)  02/27/20 214 lb 6.4 oz (97.3 kg)   Last seen for diabetes 3 months ago.  Management since then includes continuing current medications.  Encouraged continue walking 30 minutes every day .  He reports excellent compliance with treatment. He is not having side effects.  Symptoms: No fatigue No foot ulcerations  No appetite changes No nausea  No paresthesia of the feet  No polydipsia  No polyuria No visual disturbances   No vomiting     Home blood sugar records: are not being checked.  Episodes of hypoglycemia? No    Current insulin regiment: none Most Recent Eye Exam: 08/29/2019 Current exercise: walking Current diet habits: in general, a "healthy" diet     ---------------------------------------------------------------------------------------------------  Hypertension, follow-up  BP Readings from Last 3 Encounters:  08/25/20 127/89  05/27/20 133/81  02/27/20 137/76   Wt Readings from Last 3 Encounters:  08/25/20 201 lb (91.2 kg)  05/27/20 207 lb (93.9 kg)  02/27/20 214 lb 6.4 oz (97.3 kg)     He was  last seen for hypertension 3 months ago.  BP at that visit was 133/81. Management since that visit includes continuing same medications.  He reports excellent compliance with treatment. He is not having side effects.  He is exercising. He does not smoke.  Use of agents associated with hypertension: none.   Outside blood pressures are not being checked at home. Symptoms: No chest pain No chest pressure  No palpitations No syncope  No dyspnea No orthopnea  No paroxysmal nocturnal dyspnea No lower extremity edema      ---------------------------------------------------------------------------------------------------  Lipid/Cholesterol, Follow-up  Last lipid panel Other pertinent labs  Lab Results  Component Value Date   CHOL 138 05/27/2020   HDL 46 05/27/2020   LDLCALC 72 05/27/2020   TRIG 112 05/27/2020   CHOLHDL 3.0 05/27/2020   Lab Results  Component Value Date   ALT 51 (H) 05/27/2020   AST 47 (H) 05/27/2020   PLT 166 05/27/2020   TSH 3.780 06/10/2019     He was last seen for this 1 year ago.  Management since that visit includes continuing same medications.  He reports excellent compliance with treatment. He is not having side effects.    The 10-year ASCVD risk score Denman George DC Montez Hageman., et al., 2013) is: 22.8%  ---------------------------------------------------------------------------------------------------  Past Medical History:  Diagnosis Date  . Diabetes mellitus without complication (HCC)   . Gastrocnemius muscle tear 05/12/2015  . Hypertension    Past Surgical History:  Procedure Laterality Date  . COLONOSCOPY WITH PROPOFOL N/A 12/08/2016   Procedure: COLONOSCOPY WITH PROPOFOL;  Surgeon: Wyline Mood, MD;  Location: [facility] END[facility];  Service: Gastroenterology;  Laterality: N/A;   Social History   Socioeconomic History  . Marital status: Married    Spouse name: Not on file  . Number of children: 3  . Years of education: Not on file  . Highest  education level: Not on file  Occupational History    Employer: [employer]  Tobacco Use  . Smoking status: Never Smoker  . Smokeless tobacco: Never Used  Vaping Use  . Vaping Use: Never used  Substance and Sexual Activity  . Alcohol use: No  . Drug use: No  . Sexual activity: Not on file  Other Topics Concern  . Not on file  Social History Narrative   Is youngest of 75.    Has two sons and one daughter   Social Determinants of Corporate investment banker Strain: Not on file  Food Insecurity: Not on file  Transportation Needs: Not on file  Physical Activity: Not on file  Stress: Not on file  Social Connections: Not on file  Intimate Partner Violence: Not on file   Family Status  Relation Name Status  . Mother  Deceased       stroke  . Father  Deceased at age 57       cancer  . Sister  Deceased  . Brother  Alive  . Son  Alive  . Son  Alive  . Daughter  Alive  . Neg Hx  (Not Specified)   Family History  Problem Relation Age of Onset  . Diabetes Mother   . Heart disease Mother   . Colon cancer Father   . Cancer Sister        Bone Cancer  . Heart disease Brother   . Diabetes Brother   . Prostate cancer Neg Hx    No Known Allergies  Patient Care Team: Malva Limes, MD as PCP - General (Family Medicine)   Medications: Outpatient Medications Prior to Visit  Medication Sig  . acebutolol (SECTRAL) 400 MG capsule Take 1 capsule (400 mg total) by mouth daily.  Marland Kitchen allopurinol (ZYLOPRIM) 300 MG tablet Take 1 tablet (300 mg total) by mouth daily.  . cyanocobalamin 100 MCG tablet Take 100 mcg by mouth daily.  . Lansoprazole (PREVACID PO) Take 1 tablet by mouth daily as needed.  Marland Kitchen losartan-hydrochlorothiazide (HYZAAR) 100-12.5 MG tablet Take 1 tablet by mouth daily.  . metFORMIN (GLUCOPHAGE-XR) 500 MG 24 hr tablet Take 2 tablets (1,000 mg total) by mouth daily with breakfast.  . omeprazole (PRILOSEC) 40 MG capsule Take 1 capsule (40 mg total) by mouth  daily.  . polyethylene glycol powder (GLYCOLAX/MIRALAX) powder Take 17 g by mouth daily.  . pramoxine-hydrocortisone (PROCTOCREAM-HC) 1-1 % rectal cream Place 1 application rectally 2 (two) times daily.  . pravastatin (PRAVACHOL) 40 MG tablet Take 1 tablet (40 mg total) by mouth daily.  . tadalafil (CIALIS) 10 MG tablet Take 1 tablet (10 mg total) by mouth every other day as needed for erectile dysfunction.   No facility-administered medications prior to visit.    Review of Systems  Constitutional: Negative.   HENT: Negative.   Eyes: Negative.   Respiratory: Positive for cough. Negative for apnea, choking, chest tightness, shortness of breath, wheezing and stridor.   Cardiovascular: Negative.   Gastrointestinal: Negative.   Endocrine: Negative.   Genitourinary: Negative.  Musculoskeletal: Negative.   Skin: Negative.   Allergic/Immunologic: Positive for environmental allergies. Negative for food allergies and immunocompromised state.  Neurological: Negative.   Hematological: Negative.   Psychiatric/Behavioral: Negative.       Objective    BP 127/89 (BP Location: Right Arm, Patient Position: Sitting, Cuff Size: Large)   Pulse (!) 48   Ht 5\' 10"  (1.778 m)   Wt 201 lb (91.2 kg)   SpO2 100%   BMI 28.84 kg/m    Physical Exam    General Appearance:     Overweight male. Alert, cooperative, in no acute distress, appears stated age  Head:    Normocephalic, atraumatic. About 2.5cm x 1.5cm noninflamed sebaceous cyst left temporal scalp.   Eyes:    PERRL, conjunctiva/corneas clear, EOM's intact, fundi    benign, both eyes       Ears:    Normal TM's and external ear canals, both ears  Neck:   Supple, symmetrical, trachea midline, no adenopathy;       thyroid:  No enlargement/tenderness/nodules; no carotid   bruit or JVD  Back:     Symmetric, no curvature, ROM normal, no CVA tenderness  Lungs:     Clear to auscultation bilaterally, respirations unlabored  Chest wall:    No  tenderness or deformity  Heart:    Bradycardic. Normal rhythm. No murmurs, rubs, or gallops.  S1 and S2 normal  Abdomen:     Soft, non-tender, bowel sounds active all four quadrants,    no masses, no organomegaly  Genitalia:    deferred  Rectal:    deferred  Extremities:   All extremities are intact. No cyanosis or edema  Pulses:   2+ and symmetric all extremities  Skin:   Skin color, texture, turgor normal, no rashes or lesions  Lymph nodes:   Cervical, supraclavicular, and axillary nodes normal  Neurologic:   CNII-XII intact. Normal strength, sensation and reflexes      throughout    Results for orders placed or performed in visit on 08/25/20  POCT glycosylated hemoglobin (Hb A1C)  Result Value Ref Range   Hemoglobin A1C 8.0 (A) 4.0 - 5.6 %    Last depression screening scores PHQ 2/9 Scores 08/25/2020 05/27/2020 02/25/2020  PHQ - 2 Score 0 0 0  PHQ- 9 Score 0 0 0   Last fall risk screening Fall Risk  08/25/2020  Falls in the past year? 0  Number falls in past yr: 0  Injury with Fall? 0  Risk for fall due to : No Fall Risks  Follow up Falls evaluation completed   Last Audit-C alcohol use screening Alcohol Use Disorder Test (AUDIT) 08/25/2020  1. How often do you have a drink containing alcohol? 0  2. How many drinks containing alcohol do you have on a typical day when you are drinking? 0  3. How often do you have six or more drinks on one occasion? 0  AUDIT-C Score 0  Alcohol Brief Interventions/Follow-up -   A score of 3 or more in women, and 4 or more in men indicates increased risk for alcohol abuse, EXCEPT if all of the points are from question 1   No results found for any visits on 08/25/20.  Assessment & Plan    Routine Health Maintenance and Physical Exam  Exercise Activities and Dietary recommendations Goals   None     Immunization History  Administered Date(s) Administered  . Influenza,inj,Quad PF,6+ Mos 12/27/2016, 11/19/2018  . Influenza,inj,Quad PF,6-35 Mos  02/03/2020  .  Moderna Sars-Covid-2 Vaccination 04/04/2019, 05/02/2019, 01/30/2020  . Td 05/20/2013  . Tdap 05/20/2013  . Zoster Recombinat (Shingrix) 08/22/2018, 11/19/2018    Health Maintenance  Topic Date Due  . PNEUMOCOCCAL POLYSACCHARIDE VACCINE AGE 81-64 HIGH RISK  Never done  . Pneumococcal Vaccine 560-61 Years old (1 of 2 - PPSV23) Never done  . HIV Screening  Never done  . OPHTHALMOLOGY EXAM  08/28/2020  . INFLUENZA VACCINE  10/19/2020  . FOOT EXAM  10/22/2020  . HEMOGLOBIN A1C  11/27/2020  . TETANUS/TDAP  05/21/2023  . COLONOSCOPY (Pts 45-653yrs Insurance coverage will need to be confirmed)  12/09/2026  . COVID-19 Vaccine  Completed  . Hepatitis C Screening  Completed  . Zoster Vaccines- Shingrix  Completed  . HPV VACCINES  Aged Out    Discussed health benefits of physical activity, and encouraged him to engage in regular exercise appropriate for his age and condition.  He states he has had 4th dose of Covid vaccine and will bring copy for record.   1. Encounter for special screening examination for cardiovascular disorder  - EKG 12-Lead  2. Essential (primary) hypertension Well controlled.  Continue current medications.    3. Type 2 diabetes mellitus with diabetic neuropathy, without long-term current use of insulin (HCC) A1c creeping up. Will increase metformin to 750mg  BID with next refill due in July. Follow up here in about 4  Months to check A1c.   4. Sebaceous cyst (scalp) Non inflamed, not painful. Advised to call for dermatology referral if it becomes bothersome.       The entirety of the information documented in the History of Present Illness, Review of Systems and Physical Exam were personally obtained by me. Portions of this information were initially documented by the CMA and reviewed by me for thoroughness and accuracy.      Benjamin Merryonald Ionia Schey, MD  [facility] [phone] (phone) [phone] (fax)  [facility]

## 2020-08-25 NOTE — Patient Instructions (Signed)
.   I'll sent in a prescription for 750mg  metformin tablets before your next refill is due in July. You can finish taking the 500mg  as you have been until they are gone.

## 2020-08-31 ENCOUNTER — Encounter: Payer: Self-pay | Admitting: Family Medicine

## 2020-08-31 LAB — HM DIABETES EYE EXAM

## 2020-10-01 ENCOUNTER — Other Ambulatory Visit: Payer: Self-pay | Admitting: Family Medicine

## 2020-10-01 DIAGNOSIS — E1140 Type 2 diabetes mellitus with diabetic neuropathy, unspecified: Secondary | ICD-10-CM

## 2020-10-01 MED ORDER — METFORMIN HCL ER 750 MG PO TB24: 750.0000 mg | ORAL_TABLET | Freq: Two times a day (BID) | ORAL | 2 refills | Status: DC

## 2020-11-14 ENCOUNTER — Other Ambulatory Visit: Payer: Self-pay | Admitting: Family Medicine

## 2020-11-14 DIAGNOSIS — E790 Hyperuricemia without signs of inflammatory arthritis and tophaceous disease: Secondary | ICD-10-CM

## 2020-11-14 NOTE — Telephone Encounter (Signed)
Requested medication (s) are due for refill today: yes  Requested medication (s) are on the active medication list: yes  Last refill:  02/27/20  Future visit scheduled: yes  Notes to clinic:  overdue lab   Requested Prescriptions  Pending Prescriptions Disp Refills   allopurinol (ZYLOPRIM) 300 MG tablet [Pharmacy Med Name: allopurinol 300 mg tablet] 90 tablet 2    Sig: Take 1 tablet (300 mg total) by mouth daily.     Endocrinology:  Gout Agents Failed - 11/14/2020  8:02 AM      Failed - Uric Acid in normal range and within 360 days    Uric Acid  Date Value Ref Range Status  06/10/2019 4.4 3.8 - 8.4 mg/dL Final    Comment:               Therapeutic target for gout patients: <6.0          Passed - Cr in normal range and within 360 days    Creatinine, Ser  Date Value Ref Range Status  05/27/2020 1.07 0.76 - 1.27 mg/dL Final   Creatinine, POC  Date Value Ref Range Status  12/27/2016 n/a mg/dL Final          Passed - Valid encounter within last 12 months    Recent Outpatient Visits           2 months ago Annual physical exam   [facility] Malva Limes, MD   5 months ago Type 2 diabetes mellitus with diabetic neuropathy, without long-term current use of insulin (HCC)   [facility] Malva Limes, MD   8 months ago Type 2 diabetes mellitus with diabetic neuropathy, without long-term current use of insulin [facility])   [facility] Malva Limes, MD   1 year ago Diabetes mellitus without complication [facility])   [facility] Malva Limes, MD   1 year ago Physical exam   [facility] Malva Limes, MD       Future Appointments             In 1 month Fisher, Demetrios Isaacs, MD [facility]   In 9 months Fisher, Demetrios Isaacs, MD [facility]

## 2020-11-16 NOTE — Telephone Encounter (Signed)
LOV: 08/25/2020 NOV: 01/05/2021  Last refill 02/27/2020 #90 2 refills.   Lab Results  Component Value Date   LABURIC 4.4 06/10/2019

## 2021-01-05 ENCOUNTER — Encounter: Payer: Self-pay | Admitting: Family Medicine

## 2021-01-05 ENCOUNTER — Ambulatory Visit: Payer: No Typology Code available for payment source | Admitting: Family Medicine

## 2021-01-05 ENCOUNTER — Other Ambulatory Visit: Payer: Self-pay

## 2021-01-05 VITALS — BP 147/94 | HR 55 | Temp 98.0°F | Wt 204.0 lb

## 2021-01-05 DIAGNOSIS — E1140 Type 2 diabetes mellitus with diabetic neuropathy, unspecified: Secondary | ICD-10-CM | POA: Diagnosis not present

## 2021-01-05 DIAGNOSIS — E785 Hyperlipidemia, unspecified: Secondary | ICD-10-CM

## 2021-01-05 DIAGNOSIS — Z23 Encounter for immunization: Secondary | ICD-10-CM | POA: Diagnosis not present

## 2021-01-05 DIAGNOSIS — R7401 Elevation of levels of liver transaminase levels: Secondary | ICD-10-CM | POA: Diagnosis not present

## 2021-01-05 DIAGNOSIS — K219 Gastro-esophageal reflux disease without esophagitis: Secondary | ICD-10-CM

## 2021-01-05 DIAGNOSIS — I10 Essential (primary) hypertension: Secondary | ICD-10-CM

## 2021-01-05 LAB — POCT GLYCOSYLATED HEMOGLOBIN (HGB A1C): Hemoglobin A1C: 7 % — AB (ref 4.0–5.6)

## 2021-01-05 MED ORDER — PRAVASTATIN SODIUM 40 MG PO TABS: 40.0000 mg | ORAL_TABLET | Freq: Every day | ORAL | 3 refills | Status: DC

## 2021-01-05 MED ORDER — TADALAFIL 10 MG PO TABS: 10.0000 mg | ORAL_TABLET | ORAL | 3 refills | Status: DC | PRN

## 2021-01-05 MED ORDER — OMEPRAZOLE 40 MG PO CPDR: 40.0000 mg | DELAYED_RELEASE_CAPSULE | Freq: Every day | ORAL | 3 refills | Status: DC

## 2021-01-05 NOTE — Progress Notes (Signed)
Established patient visit   Patient: Benjamin [name]   DOB: Feb 09, 1961   60 y.o. Male  MRN: [id number] Visit Date: 01/05/2021  Today's healthcare provider: Lelon Huh, MD   Chief Complaint  Patient presents with   Hyperlipidemia   Hypertension   Diabetes   Subjective    HPI  Diabetes Mellitus Type II, follow-up  Lab Results  Component Value Date   HGBA1C 8.0 (A) 08/25/2020   HGBA1C 7.9 (A) 05/27/2020   HGBA1C 7.9 (A) 02/25/2020   Last seen for diabetes 4 months ago.  Management since then includes  increasing metformin to 768m twice daily. He reports excellent compliance with treatment. He is not having side effects.   Home blood sugar records:  are not being checked  Episodes of hypoglycemia? No    Current insulin regiment: none Most Recent Eye Exam: 08/31/2020  --------------------------------------------------------------------------------------------------- Hypertension, follow-up  BP Readings from Last 3 Encounters:  08/25/20 127/89  05/27/20 133/81  02/27/20 137/76   Wt Readings from Last 3 Encounters:  08/25/20 201 lb (91.2 kg)  05/27/20 207 lb (93.9 kg)  02/27/20 214 lb 6.4 oz (97.3 kg)     He was last seen for hypertension 4 months ago.  BP at that visit was 127/89. Management since that visit includes no changes. He reports excellent compliance with treatment. He is not having side effects.  He is exercising. He is adherent to low salt diet.   Outside blood pressures are 130's/80's.  He does not smoke.  Use of agents associated with hypertension: none.   --------------------------------------------------------------------------------------------------- Lipid/Cholesterol, follow-up  Last Lipid Panel: Lab Results  Component Value Date   CHOL 138 05/27/2020   LDLCALC 72 05/27/2020   HDL 46 05/27/2020   TRIG 112 05/27/2020    He was last seen for this 7 months ago.  Management since that visit includes no changes.  He  reports excellent compliance with treatment. He is not having side effects.   Symptoms: No appetite changes No foot ulcerations  No chest pain No chest pressure/discomfort  No dyspnea No orthopnea  No fatigue No lower extremity edema  No palpitations No paroxysmal nocturnal dyspnea  No nausea Yes numbness or tingling of extremity  No polydipsia No polyuria  No speech difficulty No syncope   He is following a Regular diet. Current exercise: walking  Last metabolic panel Lab Results  Component Value Date   GLUCOSE 173 (H) 05/27/2020   NA 138 05/27/2020   K 4.3 05/27/2020   BUN 15 05/27/2020   CREATININE 1.07 05/27/2020   EGFR 79 05/27/2020   GFRNONAA 81 06/10/2019   CALCIUM 9.1 05/27/2020   AST 47 (H) 05/27/2020   ALT 51 (H) 05/27/2020   The 10-year ASCVD risk score (Arnett DK, et al., 2019) is: 22.8%  ---------------------------------------------------------------------------------------------------     Medications: Outpatient Medications Prior to Visit  Medication Sig   acebutolol (SECTRAL) 400 MG capsule Take 1 capsule (400 mg total) by mouth daily.   allopurinol (ZYLOPRIM) 300 MG tablet Take 1 tablet (300 mg total) by mouth daily.   cyanocobalamin 100 MCG tablet Take 100 mcg by mouth daily.   Lansoprazole (PREVACID PO) Take 1 tablet by mouth daily as needed.   losartan-hydrochlorothiazide (HYZAAR) 100-12.5 MG tablet Take 1 tablet by mouth daily.   metFORMIN (GLUCOPHAGE-XR) 750 MG 24 hr tablet Take 1 tablet (750 mg total) by mouth in the morning and at bedtime.   omeprazole (PRILOSEC) 40 MG capsule Take 1 capsule (40  mg total) by mouth daily.   polyethylene glycol powder (GLYCOLAX/MIRALAX) powder Take 17 g by mouth daily.   pramoxine-hydrocortisone (PROCTOCREAM-HC) 1-1 % rectal cream Place 1 application rectally 2 (two) times daily.   pravastatin (PRAVACHOL) 40 MG tablet Take 1 tablet (40 mg total) by mouth daily.   tadalafil (CIALIS) 10 MG tablet Take 1 tablet (10  mg total) by mouth every other day as needed for erectile dysfunction.   No facility-administered medications prior to visit.    Review of Systems  Constitutional: Negative.   Respiratory: Negative.    Cardiovascular: Negative.   Gastrointestinal: Negative.   Endocrine: Negative.   Neurological:  Negative for dizziness, light-headedness and headaches.      Objective    BP (!) 147/94 (BP Location: Right Arm, Patient Position: Sitting, Cuff Size: Large)   Pulse (!) 55   Temp 98 F (36.7 C) (Oral)   Wt 204 lb (92.5 kg)   SpO2 99%   BMI 29.27 kg/m    Physical Exam  General appearance:  Well developed, well nourished male, cooperative and in no acute distress Head: Normocephalic, without obvious abnormality, atraumatic Respiratory: Respirations even and unlabored, normal respiratory rate Extremities: All extremities are intact.  Skin: Skin color, texture, turgor normal. No rashes seen  Psych: Appropriate mood and affect. Neurologic: Mental status: Alert, oriented to person, place, and time, thought content appropriate.   Results for orders placed or performed in visit on 01/05/21  POCT glycosylated hemoglobin (Hb A1C)  Result Value Ref Range   Hemoglobin A1C 7.0 (A) 4.0 - 5.6 %    Assessment & Plan     1. Type 2 diabetes mellitus with diabetic neuropathy, without long-term current use of insulin (HCC) Much better with increased dose of metormin.   2. Essential (primary) hypertension Not at goal today, but usually better. He is to get more strict with diet, Continue current medications.  Recheck in 3-4 months.   3. Hyperlipidemia, unspecified hyperlipidemia type refill- pravastatin (PRAVACHOL) 40 MG tablet; Take 1 tablet (40 mg total) by mouth daily.  Dispense: 90 tablet; Refill: 3  4. Elevated transaminase level Recheck  Hepatic function panel  5. Gastroesophageal reflux disease, unspecified whether esophagitis present Well controlled.  Continue current  medications.  refill omeprazole (PRILOSEC) 40 MG capsule; Take 1 capsule (40 mg total) by mouth daily.  Dispense: 90 capsule; Refill: 3  Refill - tadalafil (CIALIS) 10 MG tablet; Take 1 tablet (10 mg total) by mouth every other day as needed for erectile dysfunction.  Dispense: 10 tablet; Refill: 3    Flu vaccine given today.      The entirety of the information documented in the History of Present Illness, Review of Systems and Physical Exam were personally obtained by me. Portions of this information were initially documented by the CMA and reviewed by me for thoroughness and accuracy.     Lelon Huh, MD  [facility] [phone] (phone) [phone] (fax)  [place]

## 2021-01-06 LAB — HEPATIC FUNCTION PANEL
ALT: 29 IU/L (ref 0–44)
AST: 27 IU/L (ref 0–40)
Albumin: 4.1 g/dL (ref 3.8–4.8)
Alkaline Phosphatase: 65 IU/L (ref 44–121)
Bilirubin Total: 0.5 mg/dL (ref 0.0–1.2)
Bilirubin, Direct: 0.16 mg/dL (ref 0.00–0.40)
Total Protein: 7.2 g/dL (ref 6.0–8.5)

## 2021-01-19 ENCOUNTER — Telehealth: Payer: Self-pay

## 2021-01-19 NOTE — Telephone Encounter (Signed)
Copied from CRM [id number]. Topic: General - Other >> Jan 19, 2021  9:52 AM Marylen Ponto wrote: Reason for CRM: Pt requests that he receive a copy of his flu shot for his employer. Pt also requests call back to advise when copy of his flu vaccination is ready for pick up.

## 2021-01-20 NOTE — Telephone Encounter (Signed)
Copy of immunization report placed up front. Patient advised.

## 2021-03-10 ENCOUNTER — Other Ambulatory Visit: Payer: Self-pay | Admitting: Family Medicine

## 2021-03-10 DIAGNOSIS — I10 Essential (primary) hypertension: Secondary | ICD-10-CM

## 2021-03-10 NOTE — Telephone Encounter (Signed)
Requested medication (s) are due for refill today:   Yes  Requested medication (s) are on the active medication list:   Yes  Future visit scheduled:   Yes   Last ordered: 03/02/2020 #90, 3 refills  Returned because failed protocol due to lab work   Requested Prescriptions  Pending Prescriptions Disp Refills   losartan-hydrochlorothiazide (HYZAAR) 100-12.5 MG tablet [Pharmacy Med Name: losartan 100 mg-hydrochlorothiazide 12.5 mg tablet] 90 tablet 3    Sig: Take 1 tablet by mouth daily.     Cardiovascular: ARB + Diuretic Combos Failed - 03/10/2021  8:17 AM      Failed - K in normal range and within 180 days    Potassium  Date Value Ref Range Status  05/27/2020 4.3 3.5 - 5.2 mmol/L Final          Failed - Na in normal range and within 180 days    Sodium  Date Value Ref Range Status  05/27/2020 138 134 - 144 mmol/L Final          Failed - Cr in normal range and within 180 days    Creatinine, Ser  Date Value Ref Range Status  05/27/2020 1.07 0.76 - 1.27 mg/dL Final   Creatinine, POC  Date Value Ref Range Status  12/27/2016 n/a mg/dL Final          Failed - Ca in normal range and within 180 days    Calcium  Date Value Ref Range Status  05/27/2020 9.1 8.6 - 10.2 mg/dL Final          Failed - Last BP in normal range    BP Readings from Last 1 Encounters:  01/05/21 (!) 147/94          Passed - Patient is not pregnant      Passed - Valid encounter within last 6 months    Recent Outpatient Visits           2 months ago Type 2 diabetes mellitus with diabetic neuropathy, without long-term current use of insulin (HCC)   [facility] Malva Limes, MD   6 months ago Annual physical exam   [facility] Malva Limes, MD   9 months ago Type 2 diabetes mellitus with diabetic neuropathy, without long-term current use of insulin (HCC)   [facility] Malva Limes, MD   1 year ago Type 2 diabetes mellitus with diabetic  neuropathy, without long-term current use of insulin [facility])   [facility] Malva Limes, MD   1 year ago Diabetes mellitus without complication [facility])   [facility] Malva Limes, MD       Future Appointments             In 6 months Fisher, Demetrios Isaacs, MD [facility], [facility]

## 2021-04-20 ENCOUNTER — Other Ambulatory Visit: Payer: Self-pay | Admitting: Family Medicine

## 2021-04-20 DIAGNOSIS — I10 Essential (primary) hypertension: Secondary | ICD-10-CM

## 2021-06-03 ENCOUNTER — Other Ambulatory Visit: Payer: Self-pay | Admitting: Family Medicine

## 2021-06-29 ENCOUNTER — Other Ambulatory Visit: Payer: Self-pay | Admitting: Family Medicine

## 2021-06-29 DIAGNOSIS — E1140 Type 2 diabetes mellitus with diabetic neuropathy, unspecified: Secondary | ICD-10-CM

## 2021-09-01 ENCOUNTER — Other Ambulatory Visit: Payer: Self-pay | Admitting: Family Medicine

## 2021-09-01 DIAGNOSIS — I10 Essential (primary) hypertension: Secondary | ICD-10-CM

## 2021-09-01 NOTE — Telephone Encounter (Signed)
Requested medication (s) are due for refill today:   Yes  Requested medication (s) are on the active medication list:   Yes  Future visit scheduled:   Yes   Last ordered: 06/03/2021 #90, 0 refills  Returned because labs are due per protocol   Requested Prescriptions  Pending Prescriptions Disp Refills   losartan-hydrochlorothiazide (HYZAAR) 100-12.5 MG tablet [Pharmacy Med Name: losartan 100 mg-hydrochlorothiazide 12.5 mg tablet] 90 tablet 0    Sig: Take 1 tablet by mouth daily.     Cardiovascular: ARB + Diuretic Combos Failed - 09/01/2021  9:31 AM      Failed - K in normal range and within 180 days    Potassium  Date Value Ref Range Status  05/27/2020 4.3 3.5 - 5.2 mmol/L Final         Failed - Na in normal range and within 180 days    Sodium  Date Value Ref Range Status  05/27/2020 138 134 - 144 mmol/L Final         Failed - Cr in normal range and within 180 days    Creatinine, Ser  Date Value Ref Range Status  05/27/2020 1.07 0.76 - 1.27 mg/dL Final   Creatinine, POC  Date Value Ref Range Status  12/27/2016 n/a mg/dL Final         Failed - eGFR is 10 or above and within 180 days    GFR calc Af Amer  Date Value Ref Range Status  06/10/2019 94 >59 mL/min/1.73 Final   GFR calc non Af Amer  Date Value Ref Range Status  06/10/2019 81 >59 mL/min/1.73 Final   eGFR  Date Value Ref Range Status  05/27/2020 79 >59 mL/min/1.73 Final         Failed - Last BP in normal range    BP Readings from Last 1 Encounters:  01/05/21 (!) 147/94         Failed - Valid encounter within last 6 months    Recent Outpatient Visits           7 months ago Type 2 diabetes mellitus with diabetic neuropathy, without long-term current use of insulin ([place])   [facility] Birdie Sons, MD   1 year ago Annual physical exam   [facility] Birdie Sons, MD   1 year ago Type 2 diabetes mellitus with diabetic neuropathy, without long-term current use of  insulin ([place])   [facility] Birdie Sons, MD   1 year ago Type 2 diabetes mellitus with diabetic neuropathy, without long-term current use of insulin [facility])   [facility] Birdie Sons, MD   1 year ago Diabetes mellitus without complication [facility])   [facility] Birdie Sons, MD       Future Appointments             In 6 days Fisher, Kirstie Peri, MD [facility], [place] - Patient is not pregnant

## 2021-09-02 DIAGNOSIS — E119 Type 2 diabetes mellitus without complications: Secondary | ICD-10-CM | POA: Diagnosis not present

## 2021-09-02 DIAGNOSIS — H524 Presbyopia: Secondary | ICD-10-CM | POA: Diagnosis not present

## 2021-09-02 LAB — HM DIABETES EYE EXAM

## 2021-09-06 NOTE — Progress Notes (Signed)
I,Benjamin [name],acting as a scribe for Benjamin Huh, MD.,have documented all relevant documentation on the behalf of Benjamin Huh, MD,as directed by  Benjamin Huh, MD while in the presence of Benjamin Huh, MD.  Complete physical exam  Patient: Benjamin [name]   DOB: Nov 04, 1960   61 y.o. Male  MRN: [id number]  Subjective:    Chief Complaint  Patient presents with   Annual Exam   Diabetes   Hypertension   Hyperlipidemia    Benjamin [name] is a 61 y.o. male who presents today for a complete physical exam. He reports consuming a general diet.  Patient walks daily for 1 hr.   He generally feels well. He reports sleeping well. He does not have additional problems to discuss today.   Diabetes Mellitus Type II, follow-up  Lab Results  Component Value Date   HGBA1C 7.0 (A) 01/05/2021   HGBA1C 8.0 (A) 08/25/2020   HGBA1C 7.9 (A) 05/27/2020   Last seen for diabetes 6 months ago.  Management since then includes: continue current medication.  Much better with increased dose of metformin.   He reports excellent compliance with treatment. He is not having side effects. Home blood sugar records: fasting range: average 120 Most recent eye exam: 08/31/20 Episodes of hypoglycemia? No  Current insulin regiment: none  --------------------------------------------------------------------------------------------------- Hypertension, follow-up  BP Readings from Last 3 Encounters:  09/07/21 132/79  01/05/21 (!) 147/94  08/25/20 127/89   Wt Readings from Last 3 Encounters:  09/07/21 191 lb 14.4 oz (87 kg)  01/05/21 204 lb (92.5 kg)  08/25/20 201 lb (91.2 kg)     He was last seen for hypertension 6 months ago.  BP at that visit was 147/94. Management since that visit includes counseling to get more strict with diet, Continue current medications.  He reports excellent compliance with treatment. He is not having side effects. He is exercising. He is adherent to low salt diet.    Outside blood pressures are: 138/80-85. He does not smoke.  Use of agents associated with hypertension: none.   --------------------------------------------------------------------------------------------------- Lipid/Cholesterol, follow-up  Last Lipid Panel: Lab Results  Component Value Date   CHOL 138 05/27/2020   LDLCALC 72 05/27/2020   HDL 46 05/27/2020   TRIG 112 05/27/2020    He was last seen for this 6 months ago.  Management since that visit includes continue pravastatin.  He reports excellent compliance with treatment. He is not having side effects.  Symptoms: No appetite changes No foot ulcerations  No chest pain No chest pressure/discomfort  No dyspnea No orthopnea  No fatigue No lower extremity edema  No palpitations No paroxysmal nocturnal dyspnea  No nausea No numbness or tingling of extremity  No polydipsia No polyuria  No speech difficulty No syncope   He is following a Regular, Low Sodium diet. Current exercise: walking  Last metabolic panel Lab Results  Component Value Date   GLUCOSE 173 (H) 05/27/2020   NA 138 05/27/2020   K 4.3 05/27/2020   BUN 15 05/27/2020   CREATININE 1.07 05/27/2020   EGFR 79 05/27/2020   GFRNONAA 81 06/10/2019   CALCIUM 9.1 05/27/2020   AST 27 01/05/2021   ALT 29 01/05/2021   The 10-year ASCVD risk score (Arnett DK, et al., 2019) is: 25.1%  ---------------------------------------------------------------------------------------------------  Most recent fall risk assessment:    09/07/2021    8:54 AM  Fall Risk   Falls in the past year? 0  Number falls in past yr: 0  Injury with Fall?  0  Follow up Falls evaluation completed     Most recent depression screenings:    09/07/2021    8:54 AM 01/05/2021    8:59 AM  PHQ 2/9 Scores  PHQ - 2 Score 0 0  PHQ- 9 Score 0 0      Patient Care Team: Birdie Sons, MD as PCP - General (Family Medicine)   Outpatient Medications Prior to Visit  Medication Sig    acebutolol (SECTRAL) 400 MG capsule Take 1 capsule (400 mg total) by mouth daily.   allopurinol (ZYLOPRIM) 300 MG tablet Take 1 tablet (300 mg total) by mouth daily.   cyanocobalamin 100 MCG tablet Take 100 mcg by mouth daily.   Lansoprazole (PREVACID PO) Take 1 tablet by mouth daily as needed.   losartan-hydrochlorothiazide (HYZAAR) 100-12.5 MG tablet Take 1 tablet by mouth daily.   metFORMIN (GLUCOPHAGE-XR) 750 MG 24 hr tablet Take 1 tablet (750 mg total) by mouth in the morning and at bedtime.   omeprazole (PRILOSEC) 40 MG capsule Take 1 capsule (40 mg total) by mouth daily.   polyethylene glycol powder (GLYCOLAX/MIRALAX) powder Take 17 g by mouth daily.   pramoxine-hydrocortisone (PROCTOCREAM-HC) 1-1 % rectal cream Place 1 application rectally 2 (two) times daily.   pravastatin (PRAVACHOL) 40 MG tablet Take 1 tablet (40 mg total) by mouth daily.   tadalafil (CIALIS) 10 MG tablet Take 1 tablet (10 mg total) by mouth every other day as needed for erectile dysfunction.   No facility-administered medications prior to visit.    Review of Systems  All other systems reviewed and are negative.         Objective:     BP 132/79 (BP Location: Left Arm, Patient Position: Sitting, Cuff Size: Normal)   Pulse (!) 47   Temp 98 F (36.7 C) (Oral)   Resp 16   Wt 191 lb 14.4 oz (87 kg)   SpO2 97%   BMI 27.53 kg/m    Physical Exam    General Appearance:     Well developed, well nourished male. Alert, cooperative, in no acute distress, appears stated age  Head:    Normocephalic, without obvious abnormality, atraumatic  Eyes:    PERRL, conjunctiva/corneas clear, EOM's intact, fundi    benign, both eyes       Ears:    Normal TM's and external ear canals, both ears  Nose:   Nares normal, septum midline, mucosa normal, no drainage   or sinus tenderness  Throat:   Lips, mucosa, and tongue normal; teeth and gums normal  Neck:   Supple, symmetrical, trachea midline, no adenopathy;        thyroid:  No enlargement/tenderness/nodules; no carotid   bruit or JVD  Back:     Symmetric, no curvature, ROM normal, no CVA tenderness  Lungs:     Clear to auscultation bilaterally, respirations unlabored  Chest wall:    No tenderness or deformity  Heart:    Bradycardic. Normal rhythm. No murmurs, rubs, or gallops.  S1 and S2 normal  Abdomen:     Soft, non-tender, bowel sounds active all four quadrants,    no masses, no organomegaly  Genitalia:    deferred  Rectal:    deferred  Extremities:   All extremities are intact. No cyanosis or edema  Pulses:   2+ and symmetric all extremities  Skin:   Skin color, texture, turgor normal, no rashes or lesions  Lymph nodes:   Cervical, supraclavicular, and axillary nodes normal  Neurologic:  CNII-XII intact. Normal strength, sensation and reflexes      throughout       Assessment & Plan:    Routine Health Maintenance and Physical Exam  Immunization History  Administered Date(s) Administered   Influenza,inj,Quad PF,6+ Mos 12/27/2016, 11/19/2018, 01/05/2021   Influenza,inj,Quad PF,6-35 Mos 02/03/2020   Moderna Covid-19 Vaccine Bivalent Booster 40yr & up 12/28/2020   Moderna Sars-Covid-2 Vaccination 04/04/2019, 05/02/2019, 01/30/2020, 08/11/2020   Td 05/20/2013   Tdap 05/20/2013   Zoster Recombinat (Shingrix) 08/22/2018, 11/19/2018    Health Maintenance  Topic Date Due   HIV Screening  Never done   FOOT EXAM  10/22/2020   HEMOGLOBIN A1C  07/06/2021   OPHTHALMOLOGY EXAM  08/31/2021   INFLUENZA VACCINE  10/19/2021   TETANUS/TDAP  05/21/2023   COLONOSCOPY (Pts 45-454yrInsurance coverage will need to be confirmed)  12/09/2026   COVID-19 Vaccine  Completed   Hepatitis C Screening  Completed   Zoster Vaccines- Shingrix  Completed   HPV VACCINES  Aged Out    Discussed health benefits of physical activity, and encouraged him to engage in regular exercise appropriate for his age and condition.   2. Type 2 diabetes mellitus with  diabetic neuropathy, without long-term current use of insulin (HCC) Doing well current regiment, diet and exercise.  - Urine microalbumin-creatinine with uACR - CBC - Comprehensive metabolic panel - Lipid panel - Hemoglobin A1c  3. Prostate cancer screening  - PSA Total (Reflex To Free) ([facility] only)  4. Gastroesophageal reflux disease, unspecified whether esophagitis present Well controlled on current dose of omeprazole.   5. Hyperuricemia No gout flares since being on current dose of allopurinol.  - Uric acid  6. Essential (primary) hypertension Well controlled.  Continue current medications.   - EKG 12-Lead      JaLenetta QuakerCMA

## 2021-09-07 ENCOUNTER — Encounter: Payer: Self-pay | Admitting: Family Medicine

## 2021-09-07 ENCOUNTER — Ambulatory Visit (INDEPENDENT_AMBULATORY_CARE_PROVIDER_SITE_OTHER): Payer: BC Managed Care – PPO | Admitting: Family Medicine

## 2021-09-07 VITALS — BP 132/79 | HR 47 | Temp 98.0°F | Resp 16 | Wt 191.9 lb

## 2021-09-07 DIAGNOSIS — I10 Essential (primary) hypertension: Secondary | ICD-10-CM

## 2021-09-07 DIAGNOSIS — E1140 Type 2 diabetes mellitus with diabetic neuropathy, unspecified: Secondary | ICD-10-CM

## 2021-09-07 DIAGNOSIS — Z0000 Encounter for general adult medical examination without abnormal findings: Secondary | ICD-10-CM | POA: Diagnosis not present

## 2021-09-07 DIAGNOSIS — K219 Gastro-esophageal reflux disease without esophagitis: Secondary | ICD-10-CM | POA: Diagnosis not present

## 2021-09-07 DIAGNOSIS — Z125 Encounter for screening for malignant neoplasm of prostate: Secondary | ICD-10-CM

## 2021-09-07 DIAGNOSIS — E790 Hyperuricemia without signs of inflammatory arthritis and tophaceous disease: Secondary | ICD-10-CM

## 2021-09-07 NOTE — Patient Instructions (Addendum)
.   Please review the attached list of medications and notify my office if there are any errors.   . Please bring all of your medications to every appointment so we can make sure that our medication list is the same as yours.   

## 2021-09-08 LAB — COMPREHENSIVE METABOLIC PANEL
ALT: 24 IU/L (ref 0–44)
AST: 25 IU/L (ref 0–40)
Albumin/Globulin Ratio: 1.6 (ref 1.2–2.2)
Albumin: 4.7 g/dL (ref 3.8–4.8)
Alkaline Phosphatase: 63 IU/L (ref 44–121)
BUN/Creatinine Ratio: 12 (ref 10–24)
BUN: 16 mg/dL (ref 8–27)
Bilirubin Total: 0.8 mg/dL (ref 0.0–1.2)
CO2: 22 mmol/L (ref 20–29)
Calcium: 9.4 mg/dL (ref 8.6–10.2)
Chloride: 100 mmol/L (ref 96–106)
Creatinine, Ser: 1.37 mg/dL — ABNORMAL HIGH (ref 0.76–1.27)
Globulin, Total: 2.9 g/dL (ref 1.5–4.5)
Glucose: 108 mg/dL — ABNORMAL HIGH (ref 70–99)
Potassium: 4.2 mmol/L (ref 3.5–5.2)
Sodium: 138 mmol/L (ref 134–144)
Total Protein: 7.6 g/dL (ref 6.0–8.5)
eGFR: 58 mL/min/{1.73_m2} — ABNORMAL LOW (ref >59)

## 2021-09-08 LAB — LIPID PANEL
Chol/HDL Ratio: 2.7 ratio (ref 0.0–5.0)
Cholesterol, Total: 127 mg/dL (ref 100–199)
HDL: 47 mg/dL (ref >39)
LDL Chol Calc (NIH): 64 mg/dL (ref 0–99)
Triglycerides: 80 mg/dL (ref 0–149)
VLDL Cholesterol Cal: 16 mg/dL (ref 5–40)

## 2021-09-08 LAB — PSA TOTAL (REFLEX TO FREE): Prostate Specific Ag, Serum: 0.7 ng/mL (ref 0.0–4.0)

## 2021-09-08 LAB — MICROALBUMIN / CREATININE URINE RATIO
Creatinine, Urine: 114.7 mg/dL
Microalb/Creat Ratio: 9 mg/g creat (ref 0–29)
Microalbumin, Urine: 10.7 ug/mL

## 2021-09-08 LAB — SPECIMEN STATUS REPORT

## 2021-09-08 LAB — URIC ACID: Uric Acid: 4.1 mg/dL (ref 3.8–8.4)

## 2021-09-08 LAB — CBC
Hematocrit: 40.5 % (ref 37.5–51.0)
Hemoglobin: 13.7 g/dL (ref 13.0–17.7)
MCH: 29.7 pg (ref 26.6–33.0)
MCHC: 33.8 g/dL (ref 31.5–35.7)
MCV: 88 fL (ref 79–97)
Platelets: 164 10*3/uL (ref 150–450)
RBC: 4.61 x10E6/uL (ref 4.14–5.80)
RDW: 12.8 % (ref 11.6–15.4)
WBC: 7 10*3/uL (ref 3.4–10.8)

## 2021-09-08 LAB — HEMOGLOBIN A1C
Est. average glucose Bld gHb Est-mCnc: 131 mg/dL
Hgb A1c MFr Bld: 6.2 % — ABNORMAL HIGH (ref 4.8–5.6)

## 2021-10-05 ENCOUNTER — Other Ambulatory Visit: Payer: Self-pay | Admitting: Family Medicine

## 2021-10-05 DIAGNOSIS — E1140 Type 2 diabetes mellitus with diabetic neuropathy, unspecified: Secondary | ICD-10-CM

## 2021-11-20 ENCOUNTER — Other Ambulatory Visit: Payer: Self-pay | Admitting: Family Medicine

## 2021-11-20 DIAGNOSIS — K219 Gastro-esophageal reflux disease without esophagitis: Secondary | ICD-10-CM

## 2021-12-01 ENCOUNTER — Other Ambulatory Visit: Payer: Self-pay | Admitting: Family Medicine

## 2021-12-01 DIAGNOSIS — I10 Essential (primary) hypertension: Secondary | ICD-10-CM

## 2021-12-03 ENCOUNTER — Ambulatory Visit (INDEPENDENT_AMBULATORY_CARE_PROVIDER_SITE_OTHER): Payer: BC Managed Care – PPO | Admitting: Family Medicine

## 2021-12-03 DIAGNOSIS — Z23 Encounter for immunization: Secondary | ICD-10-CM

## 2021-12-04 NOTE — Progress Notes (Signed)
Vaccine administration only. No E&M service today.   

## 2022-01-03 ENCOUNTER — Other Ambulatory Visit: Payer: Self-pay | Admitting: Family Medicine

## 2022-01-03 DIAGNOSIS — E790 Hyperuricemia without signs of inflammatory arthritis and tophaceous disease: Secondary | ICD-10-CM

## 2022-01-07 NOTE — Progress Notes (Unsigned)
I,Roshena L Chambers,acting as a scribe for Lelon Huh, MD.,have documented all relevant documentation on the behalf of Lelon Huh, MD,as directed by  Lelon Huh, MD while in the presence of Lelon Huh, MD.    Established patient visit   Patient: Benjamin [name]   DOB: 08/09/1960   61 y.o. Male  MRN: [id number] Visit Date: 01/10/2022  Today's healthcare provider: Lelon Huh, MD   Chief Complaint  Patient presents with   Diabetes   Hypertension   Subjective    HPI  Diabetes Mellitus Type II, follow-up  Lab Results  Component Value Date   HGBA1C 6.2 (H) 09/07/2021   HGBA1C 7.0 (A) 01/05/2021   HGBA1C 8.0 (A) 08/25/2020   Last seen for diabetes 4 months ago.  Management since then includes continuing the same treatment. He reports good compliance with treatment. He is not having side effects.   Home blood sugar records:  blood sugars are not checked regularly  Episodes of hypoglycemia? No    Current insulin regiment: none Most Recent Eye Exam: UTD  Lab Results  Component Value Date   NA 138 09/07/2021   K 4.2 09/07/2021   CREATININE 1.37 (H) 09/07/2021   EGFR 58 (L) 09/07/2021   GFRNONAA 81 06/10/2019   GLUCOSE 108 (H) 09/07/2021   Last lipids Lab Results  Component Value Date   CHOL 127 09/07/2021   HDL 47 09/07/2021   LDLCALC 64 09/07/2021   TRIG 80 09/07/2021   CHOLHDL 2.7 09/07/2021    --------------------------------------------------------------------------------------------------- Hypertension, follow-up  BP Readings from Last 3 Encounters:  01/10/22 136/89  09/07/21 132/79  01/05/21 (!) 147/94   Wt Readings from Last 3 Encounters:  01/10/22 187 lb (84.8 kg)  09/07/21 191 lb 14.4 oz (87 kg)  01/05/21 204 lb (92.5 kg)     He was last seen for hypertension 4 months ago.  BP at that visit was 132/79. Management since that visit includes no changes. He reports good compliance with treatment. He is not having side  effects.  He is exercising. He is not adherent to low salt diet.   Outside blood pressures are not checked.  He does not smoke.  Use of agents associated with hypertension: none.   ---------------------------------------------------------------------------------------------------  Medications: Outpatient Medications Prior to Visit  Medication Sig   acebutolol (SECTRAL) 400 MG capsule Take 1 capsule (400 mg total) by mouth daily.   allopurinol (ZYLOPRIM) 300 MG tablet Take 1 tablet (300 mg total) by mouth daily.   cyanocobalamin 100 MCG tablet Take 100 mcg by mouth daily.   Lansoprazole (PREVACID PO) Take 1 tablet by mouth daily as needed.   losartan-hydrochlorothiazide (HYZAAR) 100-12.5 MG tablet Take 1 tablet by mouth daily.   metFORMIN (GLUCOPHAGE-XR) 750 MG 24 hr tablet Take 1 tablet (750 mg total) by mouth in the morning and at bedtime.   omeprazole (PRILOSEC) 40 MG capsule Take 1 capsule (40 mg total) by mouth daily.   polyethylene glycol powder (GLYCOLAX/MIRALAX) powder Take 17 g by mouth daily.   pramoxine-hydrocortisone (PROCTOCREAM-HC) 1-1 % rectal cream Place 1 application rectally 2 (two) times daily.   pravastatin (PRAVACHOL) 40 MG tablet Take 1 tablet (40 mg total) by mouth daily.   tadalafil (CIALIS) 10 MG tablet Take 1 tablet (10 mg total) by mouth every other day as needed for erectile dysfunction.   No facility-administered medications prior to visit.    Review of Systems  Constitutional:  Negative for appetite change, chills and fever.  Respiratory:  Negative for chest tightness, shortness of breath and wheezing.   Cardiovascular:  Negative for chest pain and palpitations.  Gastrointestinal:  Negative for abdominal pain, nausea and vomiting.       Objective    BP 136/89 (BP Location: Right Arm, Patient Position: Sitting, Cuff Size: Large)   Pulse (!) 48   Temp 97.7 F (36.5 C) (Oral)   Resp 14   Wt 187 lb (84.8 kg)   SpO2 100% Comment: room  air  BMI  26.83 kg/m    Today's Vitals   01/10/22 0844 01/10/22 0849  BP: (!) 143/85 136/89  Pulse: (!) 47 (!) 48  Resp: 14   Temp: 97.7 F (36.5 C)   TempSrc: Oral   SpO2: 100%   Weight: 187 lb (84.8 kg)    Body mass index is 26.83 kg/m.   Physical Exam   General appearance: Well developed, well nourished male, cooperative and in no acute distress Head: Normocephalic, without obvious abnormality, atraumatic Respiratory: Respirations even and unlabored, normal respiratory rate Extremities: All extremities are intact.  Skin: Skin color, texture, turgor normal. No rashes seen  Psych: Appropriate mood and affect. Neurologic: Mental status: Alert, oriented to person, place, and time, thought content appropriate.   Results for orders placed or performed in visit on 01/10/22  POCT glycosylated hemoglobin (Hb A1C)  Result Value Ref Range   Hemoglobin A1C 5.8 (A) 4.0 - 5.6 %   Est. average glucose Bld gHb Est-mCnc 120     Assessment & Plan     1. Type 2 diabetes mellitus with diabetic neuropathy, without long-term current use of insulin (HCC) Very well controlled, has been working on exercising regularly and consuming healthy diet with 17 pound weight loss over the last yearly, will reduce from BID to metFORMIN (GLUCOPHAGE-XR) 750 MG 24 hr tablet; Take 1 tablet (750 mg total) by mouth daily.  2. Essential (primary) hypertension Fairly well controlled. Counseled goal is SBP <130. Continue healthy diet and exercise, avoid sodium. Continue current medications.    Future Appointments  Date Time Provider [place]  05/13/2022  8:00 AM Birdie Sons, MD [facility]  09/09/2022  9:00 AM Birdie Sons, MD [facility]         The entirety of the information documented in the History of Present Illness, Review of Systems and Physical Exam were personally obtained by me. Portions of this information were initially documented by the CMA and reviewed by me for thoroughness and  accuracy.     Lelon Huh, MD  [facility] [phone] (phone) [phone] (fax)  [place]

## 2022-01-10 ENCOUNTER — Encounter: Payer: Self-pay | Admitting: Family Medicine

## 2022-01-10 ENCOUNTER — Ambulatory Visit: Payer: BC Managed Care – PPO | Admitting: Family Medicine

## 2022-01-10 VITALS — BP 136/89 | HR 48 | Temp 97.7°F | Resp 14 | Wt 187.0 lb

## 2022-01-10 DIAGNOSIS — I10 Essential (primary) hypertension: Secondary | ICD-10-CM | POA: Diagnosis not present

## 2022-01-10 DIAGNOSIS — E1140 Type 2 diabetes mellitus with diabetic neuropathy, unspecified: Secondary | ICD-10-CM

## 2022-01-10 LAB — POCT GLYCOSYLATED HEMOGLOBIN (HGB A1C)
Est. average glucose Bld gHb Est-mCnc: 120
Hemoglobin A1C: 5.8 % — AB (ref 4.0–5.6)

## 2022-01-10 MED ORDER — METFORMIN HCL ER 750 MG PO TB24: 750.0000 mg | ORAL_TABLET | Freq: Every day | ORAL | Status: DC

## 2022-01-10 NOTE — Patient Instructions (Signed)
Please review the attached list of medications and notify my office if there are any errors.    

## 2022-01-31 ENCOUNTER — Other Ambulatory Visit: Payer: Self-pay | Admitting: Family Medicine

## 2022-01-31 DIAGNOSIS — E785 Hyperlipidemia, unspecified: Secondary | ICD-10-CM

## 2022-02-28 ENCOUNTER — Other Ambulatory Visit: Payer: Self-pay | Admitting: Family Medicine

## 2022-02-28 DIAGNOSIS — I10 Essential (primary) hypertension: Secondary | ICD-10-CM

## 2022-03-15 ENCOUNTER — Other Ambulatory Visit: Payer: Self-pay | Admitting: Family Medicine

## 2022-03-24 ENCOUNTER — Other Ambulatory Visit: Payer: Self-pay | Admitting: Family Medicine

## 2022-03-24 DIAGNOSIS — E1140 Type 2 diabetes mellitus with diabetic neuropathy, unspecified: Secondary | ICD-10-CM

## 2022-05-11 NOTE — Progress Notes (Signed)
[place],acting as a scribe for Lelon Huh, MD.,have documented all relevant documentation on the behalf of Lelon Huh, MD,as directed by  Lelon Huh, MD while in the presence of Lelon Huh, MD.     Established patient visit   Patient: Benjamin [name]   DOB: 1960/04/23   62 y.o. Male  MRN: [id number] Visit Date: 05/13/2022  Today's healthcare provider: Lelon Huh, MD    Subjective    HPI  Diabetes Mellitus Type II, Follow-up  Lab Results  Component Value Date   HGBA1C 5.8 (A) 01/10/2022   HGBA1C 6.2 (H) 09/07/2021   HGBA1C 7.0 (A) 01/05/2021   Wt Readings from Last 3 Encounters:  05/13/22 182 lb (82.6 kg)  01/10/22 187 lb (84.8 kg)  09/07/21 191 lb 14.4 oz (87 kg)   Last seen for diabetes 4 months ago.  Management since then includes no changes. He reports good compliance with treatment. He is not having side effects.  Symptoms: No fatigue No foot ulcerations  No appetite changes No nausea  Yes paresthesia of the feet  No polydipsia  No polyuria No visual disturbances   No vomiting     Home blood sugar records:  less than 120 with occasional reading slightly higher  Episodes of hypoglycemia? No    Current insulin regiment: none Most Recent Eye Exam: 09/02/2021 Current exercise: walking Current diet habits: regular  Pertinent Labs: Lab Results  Component Value Date   CHOL 127 09/07/2021   HDL 47 09/07/2021   LDLCALC 64 09/07/2021   TRIG 80 09/07/2021   CHOLHDL 2.7 09/07/2021   Lab Results  Component Value Date   NA 138 09/07/2021   K 4.2 09/07/2021   CREATININE 1.37 (H) 09/07/2021   EGFR 58 (L) 09/07/2021   LABMICR 10.7 09/07/2021   MICRALBCREAT 9 09/07/2021     --------------------------------------------------------------------------------------------------- Patient also complains of rectal itching and irritation.  He states it started a few months ago.  He denies any change in bowel habits except for an occasional  episode of constipation.  He has at times noticed some blood on the toilet tissue.  He reports trying over the counter products with no relief.  Medications: Outpatient Medications Prior to Visit  Medication Sig   acebutolol (SECTRAL) 400 MG capsule Take 1 capsule (400 mg total) by mouth daily.   allopurinol (ZYLOPRIM) 300 MG tablet Take 1 tablet (300 mg total) by mouth daily.   cyanocobalamin 100 MCG tablet Take 100 mcg by mouth daily.   Lansoprazole (PREVACID PO) Take 1 tablet by mouth daily as needed.   losartan-hydrochlorothiazide (HYZAAR) 100-12.5 MG tablet TAKE ONE TABLET BY MOUTH EVERY DAY   metFORMIN (GLUCOPHAGE-XR) 750 MG 24 hr tablet Take 1 tablet (750 mg total) by mouth in the morning and at bedtime.   omeprazole (PRILOSEC) 40 MG capsule Take 1 capsule (40 mg total) by mouth daily.   polyethylene glycol powder (GLYCOLAX/MIRALAX) powder Take 17 g by mouth daily.   pramoxine-hydrocortisone (PROCTOCREAM-HC) 1-1 % rectal cream Place 1 application rectally 2 (two) times daily.   pravastatin (PRAVACHOL) 40 MG tablet Take 1 tablet (40 mg total) by mouth daily.   tadalafil (CIALIS) 10 MG tablet Take 1 tablet (10 mg total) by mouth every other day as needed for erectile dysfunction.   No facility-administered medications prior to visit.        Objective    BP 116/83 (BP Location: Right Arm, Patient Position: Sitting, Cuff Size: Normal)   Pulse (!) 51  Temp 97.7 F (36.5 C) (Oral)   Wt 182 lb (82.6 kg)   SpO2 100%   BMI 26.11 kg/m      A1c=5.9%   Assessment & Plan     1. Type 2 diabetes mellitus with diabetic neuropathy, without long-term current use of insulin ([place]) Doing very well with diet and weigh control. Will put metformin on hold. Advised to call if he sees fastings over 140 or randoms over 160 in which case we will start back on metformin at lower dose of '500mg'$ .   Otherwise follow up for CPE and A1c check in June as scheduled.  2. Essential (primary)  hypertension refill - acebutolol (SECTRAL) 400 MG capsule; Take 1 capsule (400 mg total) by mouth daily.  Dispense: 90 capsule; Refill: 4 - losartan-hydrochlorothiazide (HYZAAR) 100-12.5 MG tablet; Take 1 tablet by mouth daily.  Dispense: 90 tablet; Refill: 4  3. Anal itching  - hydrocortisone (PROCTO-MED HC) 2.5 % rectal cream; Place 1 Application rectally 2 (two) times daily.  Dispense: 30 g; Refill: 0   Future Appointments  Date Time Provider [place]  09/09/2022  9:00 AM Caryn Section, Kirstie Peri, MD [facility]         The entirety of the information documented in the History of Present Illness, Review of Systems and Physical Exam were personally obtained by me. Portions of this information were initially documented by the CMA and reviewed by me for thoroughness and accuracy.     Lelon Huh, MD  [place] [phone] (phone) [phone] (fax)  [place]

## 2022-05-13 ENCOUNTER — Ambulatory Visit: Payer: BC Managed Care – PPO | Admitting: Family Medicine

## 2022-05-13 VITALS — BP 116/83 | HR 51 | Temp 97.7°F | Wt 182.0 lb

## 2022-05-13 DIAGNOSIS — L290 Pruritus ani: Secondary | ICD-10-CM

## 2022-05-13 DIAGNOSIS — E1140 Type 2 diabetes mellitus with diabetic neuropathy, unspecified: Secondary | ICD-10-CM

## 2022-05-13 DIAGNOSIS — I10 Essential (primary) hypertension: Secondary | ICD-10-CM

## 2022-05-13 LAB — POCT GLYCOSYLATED HEMOGLOBIN (HGB A1C)
Est. average glucose Bld gHb Est-mCnc: 123
Hemoglobin A1C: 5.9 % — AB (ref 4.0–5.6)

## 2022-05-13 MED ORDER — LOSARTAN POTASSIUM-HCTZ 100-12.5 MG PO TABS: 1.0000 | ORAL_TABLET | Freq: Every day | ORAL | 4 refills | Status: DC

## 2022-05-13 MED ORDER — ACEBUTOLOL HCL 400 MG PO CAPS: 400.0000 mg | ORAL_CAPSULE | Freq: Every day | ORAL | 4 refills | Status: DC

## 2022-05-13 MED ORDER — HYDROCORTISONE (PERIANAL) 2.5 % EX CREA: 1.0000 | TOPICAL_CREAM | Freq: Two times a day (BID) | CUTANEOUS | 0 refills | Status: AC

## 2022-06-16 ENCOUNTER — Other Ambulatory Visit: Payer: Self-pay | Admitting: Family Medicine

## 2022-06-16 DIAGNOSIS — E1140 Type 2 diabetes mellitus with diabetic neuropathy, unspecified: Secondary | ICD-10-CM

## 2022-06-16 NOTE — Telephone Encounter (Signed)
Requested Prescriptions  Pending Prescriptions Disp Refills   metFORMIN (GLUCOPHAGE-XR) 750 MG 24 hr tablet [Pharmacy Med Name: metformin ER 750 mg tablet,extended release 24 hr] 180 tablet 0    Sig: Take 1 tablet (750 mg total) by mouth in the morning and at bedtime.     Endocrinology:  Diabetes - Biguanides Failed - 06/16/2022 10:39 AM      Failed - Cr in normal range and within 360 days    Creatinine, Ser  Date Value Ref Range Status  09/07/2021 1.37 (H) 0.76 - 1.27 mg/dL Final   Creatinine, POC  Date Value Ref Range Status  12/27/2016 n/a mg/dL Final         Failed - eGFR in normal range and within 360 days    GFR calc Af Amer  Date Value Ref Range Status  06/10/2019 94 >59 mL/min/1.73 Final   GFR calc non Af Amer  Date Value Ref Range Status  06/10/2019 81 >59 mL/min/1.73 Final   eGFR  Date Value Ref Range Status  09/07/2021 58 (L) >59 mL/min/1.73 Final         Failed - B12 Level in normal range and within 720 days    Vitamin B-12  Date Value Ref Range Status  06/10/2019 574 232 - 1,245 pg/mL Final         Failed - CBC within normal limits and completed in the last 12 months    WBC  Date Value Ref Range Status  09/07/2021 7.0 3.4 - 10.8 x10E3/uL Final   RBC  Date Value Ref Range Status  09/07/2021 4.61 4.14 - 5.80 x10E6/uL Final   Hemoglobin  Date Value Ref Range Status  09/07/2021 13.7 13.0 - 17.7 g/dL Final   Hematocrit  Date Value Ref Range Status  09/07/2021 40.5 37.5 - 51.0 % Final   MCHC  Date Value Ref Range Status  09/07/2021 33.8 31.5 - 35.7 g/dL Final   [facility]  Date Value Ref Range Status  09/07/2021 29.7 26.6 - 33.0 pg Final   MCV  Date Value Ref Range Status  09/07/2021 88 79 - 97 fL Final   No results found for: "PLTCOUNTKUC", "LABPLAT", "POCPLA" RDW  Date Value Ref Range Status  09/07/2021 12.8 11.6 - 15.4 % Final         Passed - HBA1C is between 0 and 7.9 and within 180 days    Hemoglobin A1C  Date Value Ref Range Status   05/13/2022 5.9 (A) 4.0 - 5.6 % Final   Hgb A1c MFr Bld  Date Value Ref Range Status  09/07/2021 6.2 (H) 4.8 - 5.6 % Final    Comment:             Prediabetes: 5.7 - 6.4          Diabetes: >6.4          Glycemic control for adults with diabetes: <7.0          Passed - Valid encounter within last 6 months    Recent Outpatient Visits           1 month ago Type 2 diabetes mellitus with diabetic neuropathy, without long-term current use of insulin ([place])   Benjamin [name], Benjamin E, MD   5 months ago Type 2 diabetes mellitus with diabetic neuropathy, without long-term current use of insulin ([place])   [place] Birdie Sons, MD   6 months ago Need for influenza vaccination     [place], MD   9 months ago Annual physical exam   [facility] Birdie Sons, MD   1 year ago Type 2 diabetes mellitus with diabetic neuropathy, without long-term current use of insulin [facility])   [place], Benjamin E, MD       Future Appointments             In 2 months Fisher, Kirstie Peri, MD [facility] [place]

## 2022-09-09 ENCOUNTER — Telehealth: Payer: Self-pay | Admitting: Family Medicine

## 2022-09-09 ENCOUNTER — Encounter: Payer: Self-pay | Admitting: Family Medicine

## 2022-09-09 ENCOUNTER — Ambulatory Visit (INDEPENDENT_AMBULATORY_CARE_PROVIDER_SITE_OTHER): Payer: BC Managed Care – PPO | Admitting: Family Medicine

## 2022-09-09 ENCOUNTER — Other Ambulatory Visit: Payer: Self-pay | Admitting: Family Medicine

## 2022-09-09 VITALS — BP 109/67 | HR 45 | Temp 97.8°F | Resp 12 | Ht 70.0 in | Wt 186.8 lb

## 2022-09-09 DIAGNOSIS — Z125 Encounter for screening for malignant neoplasm of prostate: Secondary | ICD-10-CM | POA: Diagnosis not present

## 2022-09-09 DIAGNOSIS — I10 Essential (primary) hypertension: Secondary | ICD-10-CM

## 2022-09-09 DIAGNOSIS — K6289 Other specified diseases of anus and rectum: Secondary | ICD-10-CM

## 2022-09-09 DIAGNOSIS — G629 Polyneuropathy, unspecified: Secondary | ICD-10-CM | POA: Diagnosis not present

## 2022-09-09 DIAGNOSIS — E1140 Type 2 diabetes mellitus with diabetic neuropathy, unspecified: Secondary | ICD-10-CM

## 2022-09-09 DIAGNOSIS — Z0000 Encounter for general adult medical examination without abnormal findings: Secondary | ICD-10-CM

## 2022-09-09 HISTORY — DX: Polyneuropathy, unspecified: G62.9

## 2022-09-09 MED ORDER — NITROGLYCERIN 0.4 % RE OINT: TOPICAL_OINTMENT | RECTAL | 1 refills | Status: DC

## 2022-09-09 MED ORDER — NITROGLYCERIN 0.4 % RE OINT
TOPICAL_OINTMENT | RECTAL | 1 refills | Status: AC
Start: 2022-09-09 — End: ?

## 2022-09-09 NOTE — Telephone Encounter (Signed)
Unable to refill per protocol, last refill by provider 09/09/22, duplicate request.E-Prescribing Status: Receipt confirmed by pharmacy (09/09/2022  9:42 AM EDT).  Requested Prescriptions  Pending Prescriptions Disp Refills   Nitroglycerin 0.4 % OINT 30 g 1    Sig: Apply 1 inch (375 mg) ointment intra-anally every 12 hours for anal fissure     Off-Protocol Failed - 09/09/2022 10:29 AM      Failed - Medication not assigned to a protocol, review manually.      Passed - Valid encounter within last 12 months    Recent Outpatient Visits           Today Annual physical exam   [facility] Malva Limes, MD   3 months ago Type 2 diabetes mellitus with diabetic neuropathy, without long-term current use of insulin (HCC)   [facility] Malva Limes, MD   8 months ago Type 2 diabetes mellitus with diabetic neuropathy, without long-term current use of insulin (HCC)   [facility] Malva Limes, MD   9 months ago Need for influenza vaccination   [facility] Malva Limes, MD   1 year ago Annual physical exam   [facility] Malva Limes, MD       Future Appointments             In 1 year Fisher, Demetrios Isaacs, MD [facility], [facility]

## 2022-09-09 NOTE — Telephone Encounter (Signed)
Pt is calling in because the pharmacy their medication Nitroglycerin 0.4 % OINT [[id number]]  was sent to is unable to fill it and he needs the medication sent to [facility] on [address] in [place].

## 2022-09-09 NOTE — Telephone Encounter (Signed)
Medication Refill - Medication: Nitroglycerin 0.4 % OINT [[id number]]  Pharmacy does not have the medication in stock. Requesting medication to be sent to the below pharmacy  Has the patient contacted their pharmacy? Yes.   (Agent: If no, request that the patient contact the pharmacy for the refill. If patient does not wish to contact the pharmacy document the reason why and proceed with request.) (Agent: If yes, when and what did the pharmacy advise?)  Preferred Pharmacy (with phone number or street name): [id number] - [place], [place] - [address] [[id number]]  Has the patient been seen for an appointment in the last year OR does the patient have an upcoming appointment? Yes.    Agent: Please be advised that RX refills may take up to 3 business days. We ask that you follow-up with your pharmacy.

## 2022-09-09 NOTE — Telephone Encounter (Signed)
Patient advised that prescription was sent to [facility] pharmacy as he requested.

## 2022-09-09 NOTE — Progress Notes (Signed)
I,Sulibeya S Dimas,acting as a scribe for Mila Merry, MD.,have documented all relevant documentation on the behalf of Mila Merry, MD,as directed by  Mila Merry, MD    Complete physical exam   Patient: Benjamin [name]   DOB: 1961/01/07   62 y.o. Male  MRN: [id number] Visit Date: 09/09/2022  Today's healthcare provider: Mila Merry, MD    Subjective    Benjamin [name] is a 62 y.o. male who presents today for a complete physical exam.  He reports consuming a general diet. Home exercise routine includes walking 5 hrs per week. He generally feels well. He reports sleeping well. He does have additional problems to discuss today.  HPI  Patient c/o rectal pain/burning sensation for a few months. He denies any constipation or blood with stools. Was previously prescribed hydrocortisone for itching which has resolved.   He also has history neuropathy, previously on B12 supplements which he stopped about a year ago. Reports his feet often feel cold and diminished sensation on the bottom of his forefoot.   Past Medical History:  Diagnosis Date   Diabetes mellitus without complication (HCC)    Gastrocnemius muscle tear 05/12/2015   Hypertension    Past Surgical History:  Procedure Laterality Date   COLONOSCOPY WITH PROPOFOL N/A 12/08/2016   Procedure: COLONOSCOPY WITH PROPOFOL;  Surgeon: Wyline Mood, MD;  Location: [facility] ENDOSCOPY;  Service: Gastroenterology;  Laterality: N/A;   Social History   Socioeconomic History   Marital status: Married    Spouse name: Not on file   Number of children: 3   Years of education: Not on file   Highest education level: Not on file  Occupational History    Employer: [employer]  Tobacco Use   Smoking status: Never   Smokeless tobacco: Never  Vaping Use   Vaping Use: Never used  Substance and Sexual Activity   Alcohol use: No   Drug use: No   Sexual activity: Not on file  Other Topics Concern   Not on file  Social History  Narrative   Is youngest of 67.    Has two sons and one daughter   Social Determinants of Corporate investment banker Strain: Not on file  Food Insecurity: Not on file  Transportation Needs: Not on file  Physical Activity: Not on file  Stress: Not on file  Social Connections: Not on file  Intimate Partner Violence: Not on file   Family Status  Relation Name Status   Mother  Deceased       stroke   Father  Deceased at age 57       cancer   Sister  Deceased   Brother  Alive   Son  Alive   Son  Alive   Daughter  Alive   Neg Hx  (Not Specified)   Family History  Problem Relation Age of Onset   Diabetes Mother    Heart disease Mother    Colon cancer Father    Cancer Sister        Bone Cancer   Heart disease Brother    Diabetes Brother    Prostate cancer Neg Hx    No Known Allergies  Patient Care Team: Malva Limes, MD as PCP - General (Family Medicine)   Medications: Outpatient Medications Prior to Visit  Medication Sig   acebutolol (SECTRAL) 400 MG capsule Take 1 capsule (400 mg total) by mouth daily.   allopurinol (ZYLOPRIM) 300 MG tablet Take 1 tablet (300  mg total) by mouth daily.   hydrocortisone (PROCTO-MED HC) 2.5 % rectal cream Place 1 Application rectally 2 (two) times daily.   Lansoprazole (PREVACID PO) Take 1 tablet by mouth daily as needed.   losartan-hydrochlorothiazide (HYZAAR) 100-12.5 MG tablet Take 1 tablet by mouth daily.   omeprazole (PRILOSEC) 40 MG capsule Take 1 capsule (40 mg total) by mouth daily.   polyethylene glycol powder (GLYCOLAX/MIRALAX) powder Take 17 g by mouth daily.   pramoxine-hydrocortisone (PROCTOCREAM-HC) 1-1 % rectal cream Place 1 application rectally 2 (two) times daily.   pravastatin (PRAVACHOL) 40 MG tablet Take 1 tablet (40 mg total) by mouth daily.   tadalafil (CIALIS) 10 MG tablet Take 1 tablet (10 mg total) by mouth every other day as needed for erectile dysfunction.   cyanocobalamin 100 MCG tablet Take 100 mcg by  mouth daily. (Patient not taking: Reported on 09/09/2022)   No facility-administered medications prior to visit.    Review of Systems  Gastrointestinal:  Positive for rectal pain.  All other systems reviewed and are negative.   Last CBC Lab Results  Component Value Date   WBC 7.0 09/07/2021   HGB 13.7 09/07/2021   HCT 40.5 09/07/2021   MCV 88 09/07/2021   MCH 29.7 09/07/2021   RDW 12.8 09/07/2021   PLT 164 09/07/2021   Last metabolic panel Lab Results  Component Value Date   GLUCOSE 108 (H) 09/07/2021   NA 138 09/07/2021   K 4.2 09/07/2021   CL 100 09/07/2021   CO2 22 09/07/2021   BUN 16 09/07/2021   CREATININE 1.37 (H) 09/07/2021   EGFR 58 (L) 09/07/2021   CALCIUM 9.4 09/07/2021   PHOS 3.4 10/24/2016   PROT 7.6 09/07/2021   ALBUMIN 4.7 09/07/2021   LABGLOB 2.9 09/07/2021   AGRATIO 1.6 09/07/2021   BILITOT 0.8 09/07/2021   ALKPHOS 63 09/07/2021   AST 25 09/07/2021   ALT 24 09/07/2021   Last lipids Lab Results  Component Value Date   CHOL 127 09/07/2021   HDL 47 09/07/2021   LDLCALC 64 09/07/2021   TRIG 80 09/07/2021   CHOLHDL 2.7 09/07/2021   Last hemoglobin A1c Lab Results  Component Value Date   HGBA1C 5.9 (A) 05/13/2022   Last thyroid functions Lab Results  Component Value Date   TSH 3.780 06/10/2019   Last vitamin B12 and Folate Lab Results  Component Value Date   VITAMINB12 574 06/10/2019      Objective    BP 109/67 (BP Location: Left Arm, Patient Position: Sitting, Cuff Size: Large)   Pulse (!) 45   Temp 97.8 F (36.6 C) (Temporal)   Resp 12   Ht 5\' 10"  (1.778 m)   Wt 186 lb 12.8 oz (84.7 kg)   BMI 26.80 kg/m  BP Readings from Last 3 Encounters:  05/13/22 116/83  01/10/22 136/89  09/07/21 132/79   Wt Readings from Last 3 Encounters:  05/13/22 182 lb (82.6 kg)  01/10/22 187 lb (84.8 kg)  09/07/21 191 lb 14.4 oz (87 kg)    Physical Exam   General Appearance:    Well developed, well nourished male. Alert, cooperative, in no  acute distress, appears stated age  Head:    Normocephalic, without obvious abnormality, atraumatic  Eyes:    PERRL, conjunctiva/corneas clear, EOM's intact, fundi    benign, both eyes       Ears:    Normal TM's and external ear canals, both ears  Nose:   Nares normal, septum midline, mucosa normal,  no drainage   or sinus tenderness  Throat:   Lips, mucosa, and tongue normal; teeth and gums normal  Neck:   Supple, symmetrical, trachea midline, no adenopathy;       thyroid:  No enlargement/tenderness/nodules; no carotid   bruit or JVD  Back:     Symmetric, no curvature, ROM normal, no CVA tenderness  Lungs:     Clear to auscultation bilaterally, respirations unlabored  Chest wall:    No tenderness or deformity  Heart:    Bradycardic. Normal rhythm. No murmurs, rubs, or gallops.  S1 and S2 normal  Abdomen:     Soft, non-tender, bowel sounds active all four quadrants,    no masses, no organomegaly  Genitalia:    deferred  Rectal:     Small irritation left of rectum c/with fissure  Extremities:   All extremities are intact. No cyanosis or edema  Pulses:   2+ and symmetric all extremities  Skin:   Skin color, texture, turgor normal, no rashes or lesions  Lymph nodes:   Cervical, supraclavicular, and axillary nodes normal  Neurologic:   CNII-XII intact. Normal strength, sensation and reflexes      Throughout  Diabetic foot exam was performed.  No deformities or other abnormal visual findings.  Posterior tibialis and dorsalis pulse intact bilaterally.  Sensation: diminished plantar aspect forefoot bilaterally, otherwise normal      Last depression screening scores    05/13/2022    8:12 AM 01/10/2022    8:53 AM 09/07/2021    8:54 AM  PHQ 2/9 Scores  PHQ - 2 Score 0 0 0  PHQ- 9 Score 0 0 0   Last fall risk screening    05/13/2022    8:12 AM  Fall Risk   Falls in the past year? 0  Number falls in past yr: 0  Injury with Fall? 0   Last Audit-C alcohol use screening     05/13/2022    8:12 AM  Alcohol Use Disorder Test (AUDIT)  1. How often do you have a drink containing alcohol? 0  2. How many drinks containing alcohol do you have on a typical day when you are drinking? 0  3. How often do you have six or more drinks on one occasion? 0  AUDIT-C Score 0   A score of 3 or more in women, and 4 or more in men indicates increased risk for alcohol abuse, EXCEPT if all of the points are from question 1   No results found for any visits on 09/09/22.  Assessment & Plan    Routine Health Maintenance and Physical Exam  Exercise Activities and Dietary recommendations  Goals   None     Immunization History  Administered Date(s) Administered   Influenza,inj,Quad PF,6+ Mos 12/27/2016, 11/19/2018, 01/05/2021, 12/03/2021   Influenza,inj,Quad PF,6-35 Mos 02/03/2020   Moderna Covid-19 Vaccine Bivalent Booster 59yrs & up 12/28/2020   Moderna Sars-Covid-2 Vaccination 04/04/2019, 05/02/2019, 01/30/2020, 08/11/2020   Td 05/20/2013   Tdap 05/20/2013   Zoster Recombinat (Shingrix) 08/22/2018, 11/19/2018    Health Maintenance  Topic Date Due   HIV Screening  Never done   COVID-19 Vaccine (6 - 2023-24 season) 11/19/2021   Diabetic kidney evaluation - eGFR measurement  09/08/2022   Diabetic kidney evaluation - Urine ACR  09/08/2022   OPHTHALMOLOGY EXAM  09/03/2022   INFLUENZA VACCINE  10/20/2022   HEMOGLOBIN A1C  11/11/2022   DTaP/Tdap/Td (3 - Td or Tdap) 05/21/2023   Colonoscopy  12/09/2026   Hepatitis  C Screening  Completed   Zoster Vaccines- Shingrix  Completed   HPV VACCINES  Aged Out    Discussed health benefits of physical activity, and encouraged him to engage in regular exercise appropriate for his age and condition.   2. Prostate cancer screening  - PSA Total (Reflex To Free) ([facility] only)  3. Primary hypertension Well controlled.  Continue current medications.   - EKG 12-Lead - TSH  4. Type 2 diabetes mellitus with diabetic neuropathy,  without long-term current use of insulin (HCC) Diet controlled.  - CBC - Comprehensive metabolic panel - Lipid panel - Hemoglobin A1c - Urine microalbumin-creatinine with uACR  5. Neuropathy Has been off of B12 supplements for the last year.  - B12  6. Rectal pain Likely fissure. Try  Nitroglycerin 0.4 % OINT; Apply 1 inch (375 mg) ointment intra-anally every 12 hours for anal fissure  Dispense: 30 g; Refill: 1  Refer GI if does not resolve.     The entirety of the information documented in the History of Present Illness, Review of Systems and Physical Exam were personally obtained by me. Portions of this information were initially documented by the CMA and reviewed by me for thoroughness and accuracy.     Mila Merry, MD  [facility] [phone] (phone) [phone] (fax)  [facility]

## 2022-09-10 LAB — COMPREHENSIVE METABOLIC PANEL
ALT: 18 IU/L (ref 0–44)
AST: 19 IU/L (ref 0–40)
Albumin: 4.4 g/dL (ref 3.9–4.9)
Alkaline Phosphatase: 73 IU/L (ref 44–121)
BUN/Creatinine Ratio: 12 (ref 10–24)
BUN: 15 mg/dL (ref 8–27)
Bilirubin Total: 1 mg/dL (ref 0.0–1.2)
CO2: 23 mmol/L (ref 20–29)
Calcium: 9.4 mg/dL (ref 8.6–10.2)
Chloride: 100 mmol/L (ref 96–106)
Creatinine, Ser: 1.24 mg/dL (ref 0.76–1.27)
Globulin, Total: 2.7 g/dL (ref 1.5–4.5)
Glucose: 107 mg/dL — ABNORMAL HIGH (ref 70–99)
Potassium: 4.1 mmol/L (ref 3.5–5.2)
Sodium: 138 mmol/L (ref 134–144)
Total Protein: 7.1 g/dL (ref 6.0–8.5)
eGFR: 65 mL/min/{1.73_m2} (ref >59)

## 2022-09-10 LAB — PSA TOTAL (REFLEX TO FREE): Prostate Specific Ag, Serum: 0.6 ng/mL (ref 0.0–4.0)

## 2022-09-10 LAB — CBC
Hematocrit: 41.7 % (ref 37.5–51.0)
Hemoglobin: 14.1 g/dL (ref 13.0–17.7)
MCH: 29.9 pg (ref 26.6–33.0)
MCHC: 33.8 g/dL (ref 31.5–35.7)
MCV: 88 fL (ref 79–97)
Platelets: 160 10*3/uL (ref 150–450)
RBC: 4.72 x10E6/uL (ref 4.14–5.80)
RDW: 12.5 % (ref 11.6–15.4)
WBC: 6.3 10*3/uL (ref 3.4–10.8)

## 2022-09-10 LAB — VITAMIN B12: Vitamin B-12: 674 pg/mL (ref 232–1245)

## 2022-09-10 LAB — HEMOGLOBIN A1C
Est. average glucose Bld gHb Est-mCnc: 146 mg/dL
Hgb A1c MFr Bld: 6.7 % — ABNORMAL HIGH (ref 4.8–5.6)

## 2022-09-10 LAB — LIPID PANEL
Chol/HDL Ratio: 2.9 ratio (ref 0.0–5.0)
Cholesterol, Total: 162 mg/dL (ref 100–199)
HDL: 55 mg/dL (ref >39)
LDL Chol Calc (NIH): 90 mg/dL (ref 0–99)
Triglycerides: 91 mg/dL (ref 0–149)
VLDL Cholesterol Cal: 17 mg/dL (ref 5–40)

## 2022-09-10 LAB — MICROALBUMIN / CREATININE URINE RATIO
Creatinine, Urine: 115.5 mg/dL
Microalb/Creat Ratio: 4 mg/g creat (ref 0–29)
Microalbumin, Urine: 5 ug/mL

## 2022-09-10 LAB — TSH: TSH: 2.4 u[IU]/mL (ref 0.450–4.500)

## 2022-09-16 DIAGNOSIS — E119 Type 2 diabetes mellitus without complications: Secondary | ICD-10-CM | POA: Diagnosis not present

## 2022-09-16 DIAGNOSIS — Z0100 Encounter for examination of eyes and vision without abnormal findings: Secondary | ICD-10-CM | POA: Diagnosis not present

## 2022-09-16 DIAGNOSIS — H2513 Age-related nuclear cataract, bilateral: Secondary | ICD-10-CM | POA: Diagnosis not present

## 2022-09-16 DIAGNOSIS — H3589 Other specified retinal disorders: Secondary | ICD-10-CM | POA: Diagnosis not present

## 2022-09-16 LAB — HM DIABETES EYE EXAM

## 2022-12-21 ENCOUNTER — Ambulatory Visit (INDEPENDENT_AMBULATORY_CARE_PROVIDER_SITE_OTHER): Payer: BC Managed Care – PPO

## 2022-12-21 DIAGNOSIS — Z23 Encounter for immunization: Secondary | ICD-10-CM

## 2023-01-16 ENCOUNTER — Other Ambulatory Visit: Payer: Self-pay | Admitting: Family Medicine

## 2023-01-16 DIAGNOSIS — K219 Gastro-esophageal reflux disease without esophagitis: Secondary | ICD-10-CM

## 2023-03-08 ENCOUNTER — Encounter: Payer: Self-pay | Admitting: Family Medicine

## 2023-03-08 NOTE — Telephone Encounter (Signed)
 Care team updated and letter sent for eye exam notes.

## 2023-03-14 ENCOUNTER — Ambulatory Visit: Payer: BC Managed Care – PPO | Admitting: Family Medicine

## 2023-03-29 ENCOUNTER — Ambulatory Visit: Payer: BC Managed Care – PPO | Admitting: Family Medicine

## 2023-03-29 ENCOUNTER — Encounter: Payer: Self-pay | Admitting: Family Medicine

## 2023-03-29 VITALS — BP 142/70 | HR 46 | Resp 16 | Ht 70.0 in | Wt 194.8 lb

## 2023-03-29 DIAGNOSIS — E1140 Type 2 diabetes mellitus with diabetic neuropathy, unspecified: Secondary | ICD-10-CM

## 2023-03-29 DIAGNOSIS — I10 Essential (primary) hypertension: Secondary | ICD-10-CM | POA: Diagnosis not present

## 2023-03-29 LAB — POCT GLYCOSYLATED HEMOGLOBIN (HGB A1C)
Est. average glucose Bld gHb Est-mCnc: 148
Hemoglobin A1C: 6.8 % — AB (ref 4.0–5.6)

## 2023-03-29 NOTE — Progress Notes (Signed)
      Established patient visit   Patient: Benjamin [name]   DOB: [birth year]   63 y.o. Male  MRN: [id number] Visit Date: 03/29/2023  Today's healthcare provider: Nancyann Perry, MD   Chief Complaint  Patient presents with   Diabetes   Hypertension   Subjective     Follow up diabetes and htn . Feels well, no complaints. Splurged a little bit over the holidays. Remains off metformin  since February.   Medications: Outpatient Medications Prior to Visit  Medication Sig   acebutolol  (SECTRAL ) 400 MG capsule Take 1 capsule (400 mg total) by mouth daily.   allopurinol  (ZYLOPRIM ) 300 MG tablet Take 1 tablet (300 mg total) by mouth daily.   cyanocobalamin  100 MCG tablet Take 100 mcg by mouth daily.   hydrocortisone  (PROCTO-MED HC ) 2.5 % rectal cream Place 1 Application rectally 2 (two) times daily.   Lansoprazole (PREVACID PO) Take 1 tablet by mouth daily as needed.   losartan -hydrochlorothiazide (HYZAAR) 100-12.5 MG tablet Take 1 tablet by mouth daily.   Nitroglycerin  0.4 % OINT Apply 1 inch (375 mg) ointment intra-anally every 12 hours for anal fissure   omeprazole  (PRILOSEC) 40 MG capsule Take 1 capsule (40 mg total) by mouth daily.   polyethylene glycol powder (GLYCOLAX /MIRALAX ) powder Take 17 g by mouth daily.   pramoxine-hydrocortisone  (PROCTOCREAM-HC) 1-1 % rectal cream Place 1 application rectally 2 (two) times daily.   pravastatin  (PRAVACHOL ) 40 MG tablet Take 1 tablet (40 mg total) by mouth daily.   tadalafil  (CIALIS ) 10 MG tablet Take 1 tablet (10 mg total) by mouth every other day as needed for erectile dysfunction.   No facility-administered medications prior to visit.    Review of Systems  Constitutional:  Negative for appetite change, chills and fever.  Respiratory:  Negative for chest tightness, shortness of breath and wheezing.   Cardiovascular:  Negative for chest pain and palpitations.  Gastrointestinal:  Negative for abdominal pain, nausea and vomiting.        Objective    BP (!) 142/70   Pulse (!) 46   Resp 16   Ht 5' 10 (1.778 m)   Wt 194 lb 12.8 oz (88.4 kg)   BMI 27.95 kg/m    Physical Exam  General appearance: Well developed, well nourished male, cooperative and in no acute distress Head: Normocephalic, without obvious abnormality, atraumatic Respiratory: Respirations even and unlabored, normal respiratory rate Extremities: All extremities are intact.  Skin: Skin color, texture, turgor normal. No rashes seen  Psych: Appropriate mood and affect. Neurologic: Mental status: Alert, oriented to person, place, and time, thought content appropriate.   Results for orders placed or performed in visit on 03/29/23  POCT glycosylated hemoglobin (Hb A1C)  Result Value Ref Range   Hemoglobin A1C 6.8 (A) 4.0 - 5.6 %   Est. average glucose Bld gHb Est-mCnc 148     Assessment & Plan     1. Type 2 diabetes mellitus with diabetic neuropathy, without long-term current use of insulin (HCC) (Primary) Well controlled with diet alone.   2. Primary hypertension Up a bit, but admits to some dietary indiscretions over the holiday and not getting as much exercise.   Continue current medications.    Future Appointments  Date Time Provider Department Center  09/18/2023  3:00 PM Mikita Lesmeister, Nancyann BRAVO, MD [facility]           Nancyann Perry, MD  [facility] [phone] (phone) [phone] (fax)  [facility]

## 2023-03-29 NOTE — Patient Instructions (Signed)
 Marland Kitchen  Please review the attached list of medications and notify my office if there are any errors.   . Please bring all of your medications to every appointment so we can make sure that our medication list is the same as yours.

## 2023-04-06 ENCOUNTER — Other Ambulatory Visit: Payer: Self-pay | Admitting: Family Medicine

## 2023-04-06 DIAGNOSIS — E785 Hyperlipidemia, unspecified: Secondary | ICD-10-CM

## 2023-04-06 NOTE — Telephone Encounter (Signed)
Requested Prescriptions  Pending Prescriptions Disp Refills   pravastatin (PRAVACHOL) 40 MG tablet [Pharmacy Med Name: pravastatin 40 mg tablet] 90 tablet 1    Sig: Take 1 tablet (40 mg total) by mouth daily.     Cardiovascular:  Antilipid - Statins Failed - 04/06/2023  3:24 PM      Failed - Lipid Panel in normal range within the last 12 months    Cholesterol, Total  Date Value Ref Range Status  09/09/2022 162 100 - 199 mg/dL Final   LDL Chol Calc (NIH)  Date Value Ref Range Status  09/09/2022 90 0 - 99 mg/dL Final   HDL  Date Value Ref Range Status  09/09/2022 55 >39 mg/dL Final   Triglycerides  Date Value Ref Range Status  09/09/2022 91 0 - 149 mg/dL Final         Passed - Patient is not pregnant      Passed - Valid encounter within last 12 months    Recent Outpatient Visits           1 week ago Type 2 diabetes mellitus with diabetic neuropathy, without long-term current use of insulin (HCC)   [facility] Sherrie Mustache, Demetrios Isaacs, MD   6 months ago Annual physical exam   [facility] Malva Limes, MD   10 months ago Type 2 diabetes mellitus with diabetic neuropathy, without long-term current use of insulin (HCC)   [facility] Malva Limes, MD   1 year ago Type 2 diabetes mellitus with diabetic neuropathy, without long-term current use of insulin (HCC)   [place] [facility] Malva Limes, MD   1 year ago Need for influenza vaccination   [facility] Malva Limes, MD       Future Appointments             In 5 months Fisher, Demetrios Isaacs, MD [facility]

## 2023-07-24 ENCOUNTER — Other Ambulatory Visit: Payer: Self-pay | Admitting: Family Medicine

## 2023-07-24 DIAGNOSIS — I10 Essential (primary) hypertension: Secondary | ICD-10-CM

## 2023-09-11 ENCOUNTER — Encounter: Payer: BC Managed Care – PPO | Admitting: Family Medicine

## 2023-09-18 ENCOUNTER — Encounter: Payer: Self-pay | Admitting: Family Medicine

## 2023-09-18 ENCOUNTER — Ambulatory Visit (INDEPENDENT_AMBULATORY_CARE_PROVIDER_SITE_OTHER): Payer: Self-pay | Admitting: Family Medicine

## 2023-09-18 VITALS — BP 132/78 | HR 42 | Resp 16 | Ht 70.0 in | Wt 199.5 lb

## 2023-09-18 DIAGNOSIS — Z125 Encounter for screening for malignant neoplasm of prostate: Secondary | ICD-10-CM

## 2023-09-18 DIAGNOSIS — Z0000 Encounter for general adult medical examination without abnormal findings: Secondary | ICD-10-CM | POA: Diagnosis not present

## 2023-09-18 DIAGNOSIS — Z23 Encounter for immunization: Secondary | ICD-10-CM

## 2023-09-18 DIAGNOSIS — E1140 Type 2 diabetes mellitus with diabetic neuropathy, unspecified: Secondary | ICD-10-CM

## 2023-09-18 DIAGNOSIS — H2513 Age-related nuclear cataract, bilateral: Secondary | ICD-10-CM | POA: Diagnosis not present

## 2023-09-18 DIAGNOSIS — I10 Essential (primary) hypertension: Secondary | ICD-10-CM | POA: Diagnosis not present

## 2023-09-18 DIAGNOSIS — E119 Type 2 diabetes mellitus without complications: Secondary | ICD-10-CM | POA: Diagnosis not present

## 2023-09-18 LAB — HM DIABETES EYE EXAM

## 2023-09-18 NOTE — Progress Notes (Signed)
 Complete physical exam   Patient: Benjamin [name]   DOB: [birth year]   63 y.o. Male  MRN: [id number] Visit Date: 09/18/2023  Today's healthcare provider: Nancyann Perry, MD   Chief Complaint  Patient presents with   Annual Exam   Subjective    [name] is a 63 y.o. male who presents today for a complete physical exam.  He reports consuming a healthy diet.  He generally feels well. He reports sleeping fairly well. He does not have additional problems to discuss today.   Continues on pravastatin  for lipids and losartan -hydrochlorothiazide for hypertension and is tolerating medications with no apparent side effects. He is also due for diabetes follow up which he is managing with diet and lifestyle. Has not yet been prescribed medications.   Lab Results  Component Value Date   HGBA1C 6.8 (A) 03/29/2023   HGBA1C 6.7 (H) 09/09/2022   HGBA1C 5.9 (A) 05/13/2022   Wt Readings from Last 3 Encounters:  09/18/23 199 lb 8 oz (90.5 kg)  03/29/23 194 lb 12.8 oz (88.4 kg)  09/09/22 186 lb 12.8 oz (84.7 kg)      Past Medical History:  Diagnosis Date   Diabetes mellitus without complication (HCC)    Gastrocnemius muscle tear 05/12/2015   Hypertension    Neuropathy 09/09/2022   Past Surgical History:  Procedure Laterality Date   COLONOSCOPY WITH PROPOFOL  N/A 12/08/2016   Procedure: COLONOSCOPY WITH PROPOFOL ;  Surgeon: Therisa Bi, MD;  Location: [facility] ENDOSCOPY;  Service: Gastroenterology;  Laterality: N/A;   Social History   Socioeconomic History   Marital status: Married    Spouse name: Not on file   Number of children: 3   Years of education: Not on file   Highest education level: 12th grade  Occupational History    Employer: [employer]  Tobacco Use   Smoking status: Never   Smokeless tobacco: Never  Vaping Use   Vaping status: Never Used  Substance and Sexual Activity   Alcohol use: No   Drug use: No   Sexual activity: Never  Other Topics  Concern   Not on file  Social History Narrative   Is youngest of 62.    Has two sons and one daughter   Social Drivers of Corporate investment banker Strain: Low Risk  (09/18/2023)   Overall Financial Resource Strain (CARDIA)    Difficulty of Paying Living Expenses: Not hard at all  Food Insecurity: No Food Insecurity (09/18/2023)   Hunger Vital Sign    Worried About Running Out of Food in the Last Year: Never true    Ran Out of Food in the Last Year: Never true  Transportation Needs: No Transportation Needs (09/18/2023)   PRAPARE - Administrator, Civil Service (Medical): No    Lack of Transportation (Non-Medical): No  Physical Activity: Sufficiently Active (09/18/2023)   Exercise Vital Sign    Days of Exercise per Week: 6 days    Minutes of Exercise per Session: 60 min  Stress: No Stress Concern Present (09/18/2023)   [employer] of Occupational Health - Occupational Stress Questionnaire    Feeling of Stress: Not at all  Social Connections: Moderately Integrated (09/18/2023)   Social Connection and Isolation Panel    Frequency of Communication with Friends and Family: Once a week    Frequency of Social Gatherings with Friends and Family: Once a week    Attends Religious Services: More than 4 times per year  Active Member of Clubs or Organizations: No    Attends Engineer, structural: More than 4 times per year    Marital Status: Married  Recent Concern: Social Connections - Moderately Isolated (09/18/2023)   Social Connection and Isolation Panel    Frequency of Communication with Friends and Family: Once a week    Frequency of Social Gatherings with Friends and Family: Once a week    Attends Religious Services: More than 4 times per year    Active Member of [employer] or Organizations: No    Attends Engineer, structural: Not on file    Marital Status: Married  Catering manager Violence: Not At Risk (09/18/2023)   Humiliation, Afraid, Rape, and Kick  questionnaire    Fear of Current or Ex-Partner: No    Emotionally Abused: No    Physically Abused: No    Sexually Abused: No   Family Status  Relation Name Status   Mother Jamine Highfill Deceased       stroke   Father  Deceased at age 55       cancer   Sister Ronal Dickinson Deceased   Brother Pattrick Bady Alive   Son  Alive   Son  Alive   Daughter  Alive   Neg Hx  (Not Specified)  No partnership data on file   Family History  Problem Relation Age of Onset   Diabetes Mother    Heart disease Mother    Cancer Mother    Colon cancer Father    Cancer Sister        Bone Cancer   Heart disease Brother    Diabetes Brother    Prostate cancer Neg Hx    No Known Allergies  Patient Care Team: Gasper Nancyann BRAVO, MD as PCP - General (Family Medicine) Pa,  Eye Care (Optometry)   Medications: Outpatient Medications Prior to Visit  Medication Sig   acebutolol  (SECTRAL ) 400 MG capsule Take 1 capsule (400 mg total) by mouth daily.   allopurinol  (ZYLOPRIM ) 300 MG tablet Take 1 tablet (300 mg total) by mouth daily.   cyanocobalamin  100 MCG tablet Take 100 mcg by mouth daily.   hydrocortisone  (PROCTO-MED HC ) 2.5 % rectal cream Place 1 Application rectally 2 (two) times daily.   Lansoprazole (PREVACID PO) Take 1 tablet by mouth daily as needed.   losartan -hydrochlorothiazide (HYZAAR) 100-12.5 MG tablet Take 1 tablet by mouth daily.   Nitroglycerin  0.4 % OINT Apply 1 inch (375 mg) ointment intra-anally every 12 hours for anal fissure   omeprazole  (PRILOSEC) 40 MG capsule Take 1 capsule (40 mg total) by mouth daily.   polyethylene glycol powder (GLYCOLAX /MIRALAX ) powder Take 17 g by mouth daily.   pramoxine-hydrocortisone  (PROCTOCREAM-HC) 1-1 % rectal cream Place 1 application rectally 2 (two) times daily.   pravastatin  (PRAVACHOL ) 40 MG tablet Take 1 tablet (40 mg total) by mouth daily.   tadalafil  (CIALIS ) 10 MG tablet Take 1 tablet (10 mg total) by mouth every other day as  needed for erectile dysfunction.   No facility-administered medications prior to visit.    Review of Systems  Constitutional:  Negative for chills, diaphoresis and fever.  HENT:  Negative for congestion, ear discharge, ear pain, hearing loss, nosebleeds, sore throat and tinnitus.   Eyes:  Negative for photophobia, pain, discharge and redness.  Respiratory:  Negative for cough, shortness of breath, wheezing and stridor.   Cardiovascular:  Negative for chest pain, palpitations and leg swelling.  Gastrointestinal:  Negative for abdominal  pain, blood in stool, constipation, diarrhea, nausea and vomiting.  Endocrine: Negative for polydipsia.  Genitourinary:  Negative for dysuria, flank pain, frequency, hematuria and urgency.  Musculoskeletal:  Negative for back pain, myalgias and neck pain.  Skin:  Negative for rash.  Allergic/Immunologic: Negative for environmental allergies.  Neurological:  Negative for dizziness, tremors, seizures, weakness and headaches.  Hematological:  Does not bruise/bleed easily.  Psychiatric/Behavioral:  Negative for hallucinations and suicidal ideas. The patient is not nervous/anxious.       Objective    BP 132/78 (BP Location: Left Arm, Patient Position: Sitting, Cuff Size: Normal)   Pulse (!) 42   Resp 16   Ht 5' 10 (1.778 m)   Wt 199 lb 8 oz (90.5 kg)   SpO2 98%   BMI 28.63 kg/m    Physical Exam   General Appearance:    Well developed, well nourished male. Alert, cooperative, in no acute distress, appears stated age  Head:    Normocephalic, without obvious abnormality, atraumatic  Eyes:    PERRL, conjunctiva/corneas clear, EOM's intact, fundi    benign, both eyes       Ears:    Normal TM's and external ear canals, both ears  Nose:   Nares normal, septum midline, mucosa normal, no drainage   or sinus tenderness  Throat:   Lips, mucosa, and tongue normal; teeth and gums normal  Neck:   Supple, symmetrical, trachea midline, no adenopathy;        thyroid :  No enlargement/tenderness/nodules; no carotid   bruit or JVD  Back:     Symmetric, no curvature, ROM normal, no CVA tenderness  Lungs:     Clear to auscultation bilaterally, respirations unlabored  Chest wall:    No tenderness or deformity  Heart:    Bradycardic. Normal rhythm. No murmurs, rubs, or gallops.  S1 and S2 normal  Abdomen:     Soft, non-tender, bowel sounds active all four quadrants,    no masses, no organomegaly  Genitalia:    deferred  Rectal:    deferred  Extremities:   All extremities are intact. No cyanosis or edema  Pulses:   2+ and symmetric all extremities  Skin:   Skin color, texture, turgor normal, no rashes or lesions  Lymph nodes:   Cervical, supraclavicular, and axillary nodes normal  Neurologic:   CNII-XII intact. Normal strength, sensation and reflexes      throughout     Last depression screening scores    09/18/2023    3:06 PM 09/09/2022    9:00 AM 05/13/2022    8:12 AM  PHQ 2/9 Scores  PHQ - 2 Score 0 0 0  PHQ- 9 Score  0 0   Last fall risk screening    09/18/2023    3:06 PM  Fall Risk   Falls in the past year? 0  Number falls in past yr: 0  Injury with Fall? 0  Risk for fall due to : No Fall Risks   Last Audit-C alcohol use screening    09/18/2023    3:08 PM  Alcohol Use Disorder Test (AUDIT)  1. How often do you have a drink containing alcohol? 0  2. How many drinks containing alcohol do you have on a typical day when you are drinking? 0  3. How often do you have six or more drinks on one occasion? 0  AUDIT-C Score 0   A score of 3 or more in women, and 4 or more in men indicates  increased risk for alcohol abuse, EXCEPT if all of the points are from question 1   Results for orders placed or performed in visit on 09/18/23  HM DIABETES EYE EXAM  Result Value Ref Range   HM Diabetic Eye Exam No Retinopathy No Retinopathy    Assessment & Plan    Routine Health Maintenance and Physical Exam  Exercise Activities and Dietary  recommendations  Goals   None     Immunization History  Administered Date(s) Administered   Influenza, Seasonal, Injecte, Preservative Fre 12/21/2022   Influenza,inj,Quad PF,6+ Mos 12/27/2016, 11/19/2018, 01/05/2021, 12/03/2021   Influenza,inj,Quad PF,6-35 Mos 02/03/2020   Moderna Covid-19 Vaccine Bivalent Booster 58yrs & up 12/28/2020   Moderna Sars-Covid-2 Vaccination 04/04/2019, 05/02/2019, 01/30/2020, 08/11/2020   PNEUMOCOCCAL CONJUGATE-20 09/18/2023   Td 05/20/2013   Tdap 05/20/2013, 09/18/2023   Zoster Recombinant(Shingrix ) 08/22/2018, 11/19/2018    Health Maintenance  Topic Date Due   HIV Screening  Never done   COVID-19 Vaccine (6 - 2024-25 season) 11/20/2022   Diabetic kidney evaluation - eGFR measurement  09/09/2023   Diabetic kidney evaluation - Urine ACR  09/09/2023   HEMOGLOBIN A1C  09/26/2023   INFLUENZA VACCINE  10/20/2023   OPHTHALMOLOGY EXAM  09/17/2024   Colonoscopy  12/09/2026   DTaP/Tdap/Td (4 - Td or Tdap) 09/17/2033   Pneumococcal Vaccine 31-40 Years old  Completed   Hepatitis C Screening  Completed   Zoster Vaccines- Shingrix   Completed   Hepatitis B Vaccines  Aged Out   HPV VACCINES  Aged Out   Meningococcal B Vaccine  Aged Out    Discussed health benefits of physical activity, and encouraged him to engage in regular exercise appropriate for his age and condition.   2. Prostate cancer screening  - PSA Total (Reflex To Free)  3. Need for pneumococcal vaccination  - Pneumococcal conjugate vaccine 20-valent (Prevnar 20)  4. Need for prophylactic vaccination using tetanus and diphtheria toxoids adsorbed (Td) vaccine  - Tdap vaccine greater than or equal to 7yo IM  5. Primary hypertension  - Comprehensive metabolic panel with GFR  6. Type 2 diabetes mellitus with diabetic neuropathy, without long-term current use of insulin (HCC)  - Urine microalbumin-creatinine with uACR - Hemoglobin A1c - Lipid panel        Nancyann Perry, MD   [facility] [phone] (phone) [phone] (fax)  [facility]

## 2023-09-19 ENCOUNTER — Ambulatory Visit: Payer: Self-pay | Admitting: Family Medicine

## 2023-09-19 DIAGNOSIS — E1140 Type 2 diabetes mellitus with diabetic neuropathy, unspecified: Secondary | ICD-10-CM

## 2023-09-19 LAB — LIPID PANEL
Chol/HDL Ratio: 2.9 ratio (ref 0.0–5.0)
Cholesterol, Total: 140 mg/dL (ref 100–199)
HDL: 48 mg/dL (ref >39)
LDL Chol Calc (NIH): 72 mg/dL (ref 0–99)
Triglycerides: 112 mg/dL (ref 0–149)
VLDL Cholesterol Cal: 20 mg/dL (ref 5–40)

## 2023-09-19 LAB — PSA TOTAL (REFLEX TO FREE): Prostate Specific Ag, Serum: 1.7 ng/mL (ref 0.0–4.0)

## 2023-09-19 LAB — COMPREHENSIVE METABOLIC PANEL WITH GFR
ALT: 34 IU/L (ref 0–44)
AST: 29 IU/L (ref 0–40)
Albumin: 4.4 g/dL (ref 3.9–4.9)
Alkaline Phosphatase: 74 IU/L (ref 44–121)
BUN/Creatinine Ratio: 9 — ABNORMAL LOW (ref 10–24)
BUN: 13 mg/dL (ref 8–27)
Bilirubin Total: 0.7 mg/dL (ref 0.0–1.2)
CO2: 24 mmol/L (ref 20–29)
Calcium: 9.3 mg/dL (ref 8.6–10.2)
Chloride: 101 mmol/L (ref 96–106)
Creatinine, Ser: 1.43 mg/dL — ABNORMAL HIGH (ref 0.76–1.27)
Globulin, Total: 2.8 g/dL (ref 1.5–4.5)
Glucose: 119 mg/dL — ABNORMAL HIGH (ref 70–99)
Potassium: 4.2 mmol/L (ref 3.5–5.2)
Sodium: 141 mmol/L (ref 134–144)
Total Protein: 7.2 g/dL (ref 6.0–8.5)
eGFR: 55 mL/min/{1.73_m2} — ABNORMAL LOW (ref >59)

## 2023-09-19 LAB — HEMOGLOBIN A1C
Est. average glucose Bld gHb Est-mCnc: 192 mg/dL
Hgb A1c MFr Bld: 8.3 % — ABNORMAL HIGH (ref 4.8–5.6)

## 2023-09-19 LAB — MICROALBUMIN / CREATININE URINE RATIO
Creatinine, Urine: 154.7 mg/dL
Microalb/Creat Ratio: 7 mg/g{creat} (ref 0–29)
Microalbumin, Urine: 11 ug/mL

## 2023-09-21 MED ORDER — METFORMIN HCL ER 500 MG PO TB24: 500.0000 mg | ORAL_TABLET | Freq: Every day | ORAL | 1 refills | Status: DC

## 2023-11-22 ENCOUNTER — Ambulatory Visit: Admitting: Family Medicine

## 2023-11-22 VITALS — BP 131/77 | HR 50 | Ht 70.0 in | Wt 193.2 lb

## 2023-11-22 DIAGNOSIS — Z7984 Long term (current) use of oral hypoglycemic drugs: Secondary | ICD-10-CM | POA: Diagnosis not present

## 2023-11-22 DIAGNOSIS — E1140 Type 2 diabetes mellitus with diabetic neuropathy, unspecified: Secondary | ICD-10-CM | POA: Diagnosis not present

## 2023-11-22 LAB — POCT GLYCOSYLATED HEMOGLOBIN (HGB A1C): Hemoglobin A1C: 8.4 % — AB (ref 4.0–5.6)

## 2023-11-22 MED ORDER — METFORMIN HCL ER 500 MG PO TB24: 1000.0000 mg | ORAL_TABLET | Freq: Every day | ORAL | 3 refills | Status: AC

## 2023-11-22 NOTE — Patient Instructions (Signed)
 SABRA  Please review the attached list of medications and notify my office if there are any errors.   . Please bring all of your medications to every appointment so we can make sure that our medication list is the same as yours.

## 2023-11-22 NOTE — Progress Notes (Signed)
 Established patient visit   Patient: Benjamin [name]   DOB: Sep 25, 1960   63 y.o. Male  MRN: [id number] Visit Date: 11/22/2023  Today's healthcare provider: Nancyann Perry, MD   Chief Complaint  Patient presents with   Medical Management of Chronic Issues   Diabetes    Management since then includes starting back on metformin  500mg  once a day . He reports excellent compliance with treatment. He is not having side effects.  Symptoms: paresthesia of the feet Episodes of hypoglycemia? No  Current insulin regiment: none Most Recent Eye Exam: 09/18/23 Current exercise: walking Current diet habits: well balanced   Immunizations    Influenza - will attend flu clinic   Subjective    Discussed the use of AI scribe software for clinical note transcription with the patient, who gave verbal consent to proceed.  History of Present Illness   Benjamin [name] is a 63 year old male with type 2 diabetes who presents for follow-up of his blood sugar management.  He is currently taking metformin  500 mg once daily, which he resumed since his last visit when his A1c was 8.1. He monitors his blood sugar at home, with readings sometimes around 127 mg/dL and occasionally higher, such as 170 mg/dL. His hemoglobin A1c is currently 8.4%. He recalls that his A1c was previously as low as 5.9% for about a year.  He has not been consistent with his walking routine, attributing this to getting off work later and having more projects, resulting in less time for exercise. Previously, he would walk daily for 30 minutes to an hour during the week and for two hours each on Saturday and Sunday. He notes that his blood sugar was better controlled when he was more active, with an A1c of 5.9% for about a year.  He is currently using [facility] for his prescriptions.     Wt Readings from Last 3 Encounters:  11/22/23 193 lb 3.2 oz (87.6 kg)  09/18/23 199 lb 8 oz (90.5 kg)  03/29/23 194 lb 12.8 oz (88.4  kg)   Lab Results  Component Value Date   HGBA1C 8.3 (H) 09/18/2023   HGBA1C 6.8 (A) 03/29/2023   HGBA1C 6.7 (H) 09/09/2022     Medications: Outpatient Medications Prior to Visit  Medication Sig   acebutolol  (SECTRAL ) 400 MG capsule Take 1 capsule (400 mg total) by mouth daily.   allopurinol  (ZYLOPRIM ) 300 MG tablet Take 1 tablet (300 mg total) by mouth daily.   cyanocobalamin  100 MCG tablet Take 100 mcg by mouth daily.   hydrocortisone  (PROCTO-MED HC ) 2.5 % rectal cream Place 1 Application rectally 2 (two) times daily.   Lansoprazole (PREVACID PO) Take 1 tablet by mouth daily as needed.   losartan -hydrochlorothiazide (HYZAAR) 100-12.5 MG tablet Take 1 tablet by mouth daily.   Nitroglycerin  0.4 % OINT Apply 1 inch (375 mg) ointment intra-anally every 12 hours for anal fissure   omeprazole  (PRILOSEC) 40 MG capsule Take 1 capsule (40 mg total) by mouth daily.   polyethylene glycol powder (GLYCOLAX /MIRALAX ) powder Take 17 g by mouth daily.   pramoxine-hydrocortisone  (PROCTOCREAM-HC) 1-1 % rectal cream Place 1 application rectally 2 (two) times daily.   pravastatin  (PRAVACHOL ) 40 MG tablet Take 1 tablet (40 mg total) by mouth daily.   tadalafil  (CIALIS ) 10 MG tablet Take 1 tablet (10 mg total) by mouth every other day as needed for erectile dysfunction.   metFORMIN  (GLUCOPHAGE -XR) 500 MG 24 hr tablet Take 1 tablet (500 mg  total) by mouth daily with breakfast.   No facility-administered medications prior to visit.   Review of Systems  Constitutional:  Negative for appetite change, chills and fever.  Respiratory:  Negative for chest tightness, shortness of breath and wheezing.   Cardiovascular:  Negative for chest pain and palpitations.  Gastrointestinal:  Negative for abdominal pain, nausea and vomiting.       Objective    BP 131/77 (BP Location: Left Arm, Patient Position: Sitting, Cuff Size: Normal)   Pulse (!) 50   Ht 5' 10 (1.778 m)   Wt 193 lb 3.2 oz (87.6 kg)   SpO2 100%    BMI 27.72 kg/m   Physical Exam   General appearance: Well developed, well nourished male, cooperative and in no acute distress Head: Normocephalic, without obvious abnormality, atraumatic Respiratory: Respirations even and unlabored, normal respiratory rate Extremities: All extremities are intact.  Skin: Skin color, texture, turgor normal. No rashes seen  Psych: Appropriate mood and affect. Neurologic: Mental status: Alert, oriented to person, place, and time, thought content appropriate.   A1c=8.4    Assessment & Plan    1. Type 2 diabetes mellitus with diabetic neuropathy, without long-term current use of insulin (HCC) (Primary) Has started back on metformin  but now only taking 1 500mg  daily whereas he took 2 x 750 in the past. He is tolerating well, so will double dose to  metFORMIN  (GLUCOPHAGE -XR) 500 MG 24 hr tablet; Take 2 tablets (1,000 mg total) by mouth daily with breakfast.  Dispense: 180 tablet; Refill: 3  Check renal panel at follow up appointment.   Return in about 15 weeks (around 03/06/2024).     Nancyann Perry, MD  [facility] [phone] (phone) [phone] (fax)  [facility]

## 2023-11-28 ENCOUNTER — Ambulatory Visit (INDEPENDENT_AMBULATORY_CARE_PROVIDER_SITE_OTHER)

## 2023-11-28 DIAGNOSIS — Z23 Encounter for immunization: Secondary | ICD-10-CM | POA: Diagnosis not present

## 2023-11-28 DIAGNOSIS — E1140 Type 2 diabetes mellitus with diabetic neuropathy, unspecified: Secondary | ICD-10-CM

## 2023-11-28 NOTE — Progress Notes (Signed)
 SABRA

## 2023-12-11 ENCOUNTER — Other Ambulatory Visit: Payer: Self-pay | Admitting: Family Medicine

## 2023-12-11 DIAGNOSIS — I10 Essential (primary) hypertension: Secondary | ICD-10-CM

## 2023-12-11 NOTE — Telephone Encounter (Signed)
 Copied from CRM [id number]. Topic: Clinical - Medication Refill >> Dec 11, 2023  9:10 AM Wess RAMAN wrote: Medication: acebutolol  (SECTRAL ) 400 MG capsule   Has the patient contacted their pharmacy? Yes (Agent: If no, request that the patient contact the pharmacy for the refill. If patient does not wish to contact the pharmacy document the reason why and proceed with request.) (Agent: If yes, when and what did the pharmacy advise?) Contact pharmacy  This is the patient's preferred pharmacy:  [facility] - [place], [place] - [address] [place] [address] Phone: [phone] Fax: [phone]  Is this the correct pharmacy for this prescription? Yes If no, delete pharmacy and type the correct one.   Has the prescription been filled recently? Yes  Is the patient out of the medication? Yes  Has the patient been seen for an appointment in the last year OR does the patient have an upcoming appointment? Yes  Can we respond through MyChart? Yes  Agent: Please be advised that Rx refills may take up to 3 business days. We ask that you follow-up with your pharmacy.

## 2023-12-12 NOTE — Telephone Encounter (Signed)
 Requested Prescriptions  Refused Prescriptions Disp Refills   acebutolol  (SECTRAL ) 400 MG capsule 90 capsule 4    Sig: Take 1 capsule (400 mg total) by mouth daily.     Cardiovascular: Beta Blockers 2 Failed - 12/12/2023 10:37 AM      Failed - Cr in normal range and within 360 days    Creatinine, Ser  Date Value Ref Range Status  09/18/2023 1.43 (H) 0.76 - 1.27 mg/dL Final   Creatinine, POC  Date Value Ref Range Status  12/27/2016 n/a mg/dL Final         Passed - Last BP in normal range    BP Readings from Last 1 Encounters:  11/22/23 131/77         Passed - Last Heart Rate in normal range    Pulse Readings from Last 1 Encounters:  11/22/23 (!) 50         Passed - Valid encounter within last 6 months    Recent Outpatient Visits           2 weeks ago Type 2 diabetes mellitus with diabetic neuropathy, without long-term current use of insulin (HCC)   [facility] Gasper Nancyann BRAVO, MD   2 months ago Annual physical exam   [facility] Gasper Nancyann BRAVO, MD       Future Appointments             In 9 months Fisher, Nancyann BRAVO, MD [facility] [place]

## 2024-01-05 NOTE — Progress Notes (Signed)
 Benjamin [name]                                          MRN: [id number]   01/05/2024   The [facility] Quality Team Specialist reviewed this patient medical record for the purposes of chart review for care gap closure. The following were reviewed: chart review for care gap closure-glycemic status assessment.    [facility] Quality Team

## 2024-02-26 ENCOUNTER — Telehealth: Payer: Self-pay

## 2024-02-26 NOTE — Telephone Encounter (Signed)
 Patient was identified as falling into the [place] Measure - Diabetes.   Patient was: Appointment scheduled with primary care provider in the next 30 days.

## 2024-03-04 ENCOUNTER — Encounter: Payer: Self-pay | Admitting: Family Medicine

## 2024-03-04 ENCOUNTER — Ambulatory Visit: Admitting: Family Medicine

## 2024-03-04 VITALS — BP 121/70 | HR 57 | Resp 16 | Ht 70.0 in | Wt 195.5 lb

## 2024-03-04 DIAGNOSIS — E790 Hyperuricemia without signs of inflammatory arthritis and tophaceous disease: Secondary | ICD-10-CM | POA: Diagnosis not present

## 2024-03-04 DIAGNOSIS — Z7984 Long term (current) use of oral hypoglycemic drugs: Secondary | ICD-10-CM

## 2024-03-04 DIAGNOSIS — I10 Essential (primary) hypertension: Secondary | ICD-10-CM

## 2024-03-04 DIAGNOSIS — E1140 Type 2 diabetes mellitus with diabetic neuropathy, unspecified: Secondary | ICD-10-CM | POA: Diagnosis not present

## 2024-03-04 MED ORDER — ALLOPURINOL 300 MG PO TABS: 300.0000 mg | ORAL_TABLET | Freq: Every day | ORAL | 4 refills | Status: AC

## 2024-03-04 NOTE — Progress Notes (Signed)
 Established patient visit   Patient: Benjamin [name]   DOB: 09-12-1960   63 y.o. Male  MRN: [id number] Visit Date: 03/04/2024  Today's healthcare provider: Nancyann Perry, MD   Chief Complaint  Patient presents with   Medical Management of Chronic Issues    T2DM, HTN, recheck renal panel   Subjective    Discussed the use of AI scribe software for clinical note transcription with the patient, who gave verbal consent to proceed.  History of Present Illness   Benjamin [name] is a 63 year old male with type 2 diabetes and chronic kidney disease who presents for follow-up.  His last A1c was 8.4% in September, and his creatinine was 1.43 mg/dL with an estimated GFR of 55 mL/min/1.58m on June 30th. His metformin  dose was increased to 500 mg, two tablets daily, at his last visit in September.  He experiences gastrointestinal discomfort since the increase in metformin  dosage, describing it as 'not comfortable' and causing increased bathroom frequency. Once the initial discomfort passes, 'everything runs smooth.' He has not been monitoring his blood sugars recently due to the holiday season and a busy work schedule.  No chest pain, heart flutters, or shortness of breath. He has not taken his gout medication for about a month due to running out, but he has not experienced any gout symptoms such as swelling, burning, or hotness.  He confirms receiving his flu shot and pneumonia vaccine this year.     Lab Results  Component Value Date   HGBA1C 8.4 (A) 11/22/2023   HGBA1C 8.3 (H) 09/18/2023   HGBA1C 6.8 (A) 03/29/2023   Lab Results  Component Value Date   NA 141 09/18/2023   K 4.2 09/18/2023   CREATININE 1.43 (H) 09/18/2023   EGFR 55 (L) 09/18/2023   GLUCOSE 119 (H) 09/18/2023   Lab Results  Component Value Date   WBC 6.3 09/09/2022   HGB 14.1 09/09/2022   HCT 41.7 09/09/2022   MCV 88 09/09/2022   PLT 160 09/09/2022     Medications: Show/hide medication  list[1]      Objective    BP 121/70 (BP Location: Right Arm, Patient Position: Sitting, Cuff Size: Normal)   Pulse (!) 57   Resp 16   Ht 5' 10 (1.778 m)   Wt 195 lb 8 oz (88.7 kg)   SpO2 100%   BMI 28.05 kg/m   Physical Exam   General appearance: Well developed, well nourished male, cooperative and in no acute distress Head: Normocephalic, without obvious abnormality, atraumatic Respiratory: Respirations even and unlabored, normal respiratory rate Extremities: All extremities are intact.  Skin: Skin color, texture, turgor normal. No rashes seen  Psych: Appropriate mood and affect. Neurologic: Mental status: Alert, oriented to person, place, and time, thought content appropriate.    Assessment & Plan        Type 2 diabetes mellitus complicated by diabetic neuropathy and stage 3 chronic kidney disease Type 2 diabetes with previous A1c of 8.4%. Increased metformin  dose caused gastrointestinal discomfort. No recent blood glucose monitoring. CKD stage 3 with creatinine 1.43 and eGFR 55. - Ordered A1c test. - Consider reducing metformin  if A1c improves.  Hyperuricemia No recent gout symptoms. Off gout medication for a month without issues. - Refilled gout medication prescription.  General Health Maintenance Vaccinations up to date. - Continue routine health maintenance and vaccinations as needed.        Nancyann Perry, MD  [facility] [phone] (phone)  [phone] (fax)  [facility]     [1]  Outpatient Medications Prior to Visit  Medication Sig   acebutolol  (SECTRAL ) 400 MG capsule Take 1 capsule (400 mg total) by mouth daily.   cyanocobalamin  100 MCG tablet Take 100 mcg by mouth daily.   hydrocortisone  (PROCTO-MED HC ) 2.5 % rectal cream Place 1 Application rectally 2 (two) times daily.   Lansoprazole (PREVACID PO) Take 1 tablet by mouth daily as needed.   losartan -hydrochlorothiazide (HYZAAR) 100-12.5 MG tablet Take 1  tablet by mouth daily.   metFORMIN  (GLUCOPHAGE -XR) 500 MG 24 hr tablet Take 2 tablets (1,000 mg total) by mouth daily with breakfast.   Nitroglycerin  0.4 % OINT Apply 1 inch (375 mg) ointment intra-anally every 12 hours for anal fissure   omeprazole  (PRILOSEC) 40 MG capsule Take 1 capsule (40 mg total) by mouth daily.   polyethylene glycol powder (GLYCOLAX /MIRALAX ) powder Take 17 g by mouth daily.   pramoxine-hydrocortisone  (PROCTOCREAM-HC) 1-1 % rectal cream Place 1 application rectally 2 (two) times daily.   pravastatin  (PRAVACHOL ) 40 MG tablet Take 1 tablet (40 mg total) by mouth daily.   tadalafil  (CIALIS ) 10 MG tablet Take 1 tablet (10 mg total) by mouth every other day as needed for erectile dysfunction.   [DISCONTINUED] allopurinol  (ZYLOPRIM ) 300 MG tablet Take 1 tablet (300 mg total) by mouth daily.   No facility-administered medications prior to visit.

## 2024-03-05 ENCOUNTER — Ambulatory Visit: Payer: Self-pay | Admitting: Family Medicine

## 2024-03-05 DIAGNOSIS — N1832 Chronic kidney disease, stage 3b: Secondary | ICD-10-CM

## 2024-03-05 LAB — RENAL FUNCTION PANEL
Albumin: 4.3 g/dL (ref 3.9–4.9)
BUN/Creatinine Ratio: 10 (ref 10–24)
BUN: 24 mg/dL (ref 8–27)
CO2: 21 mmol/L (ref 20–29)
Calcium: 9.2 mg/dL (ref 8.6–10.2)
Chloride: 101 mmol/L (ref 96–106)
Creatinine, Ser: 2.3 mg/dL — ABNORMAL HIGH (ref 0.76–1.27)
Glucose: 217 mg/dL — ABNORMAL HIGH (ref 70–99)
Phosphorus: 3.8 mg/dL (ref 2.8–4.1)
Potassium: 4.5 mmol/L (ref 3.5–5.2)
Sodium: 139 mmol/L (ref 134–144)
eGFR: 31 mL/min/1.73 — ABNORMAL LOW (ref >59)

## 2024-03-05 LAB — HEMOGLOBIN A1C
Est. average glucose Bld gHb Est-mCnc: 177 mg/dL
Hgb A1c MFr Bld: 7.8 % — ABNORMAL HIGH (ref 4.8–5.6)

## 2024-04-26 ENCOUNTER — Other Ambulatory Visit: Payer: Self-pay | Admitting: Nephrology

## 2024-04-26 DIAGNOSIS — N179 Acute kidney failure, unspecified: Secondary | ICD-10-CM

## 2024-04-26 DIAGNOSIS — R8290 Unspecified abnormal findings in urine: Secondary | ICD-10-CM

## 2024-06-05 ENCOUNTER — Ambulatory Visit: Admitting: Family Medicine

## 2024-09-18 ENCOUNTER — Encounter: Admitting: Family Medicine
